# Patient Record
Sex: Female | Born: 1955
Health system: Southern US, Community
[De-identification: ages and names within clinical notes are randomized; demographics above are authoritative.]

## PROBLEM LIST (undated history)

## (undated) DIAGNOSIS — G5600 Carpal tunnel syndrome, unspecified upper limb: Secondary | ICD-10-CM

## (undated) DIAGNOSIS — E0500 Thyrotoxicosis with diffuse goiter without thyrotoxic crisis or storm: Secondary | ICD-10-CM

## (undated) DIAGNOSIS — H9190 Unspecified hearing loss, unspecified ear: Secondary | ICD-10-CM

## (undated) DIAGNOSIS — M1990 Unspecified osteoarthritis, unspecified site: Secondary | ICD-10-CM

## (undated) DIAGNOSIS — M130 Polyarthritis, unspecified: Secondary | ICD-10-CM

## (undated) DIAGNOSIS — I341 Nonrheumatic mitral (valve) prolapse: Secondary | ICD-10-CM

## (undated) DIAGNOSIS — E079 Disorder of thyroid, unspecified: Secondary | ICD-10-CM

## (undated) HISTORY — DX: Polyarthritis, unspecified: M13.0

## (undated) HISTORY — DX: Thyrotoxicosis with diffuse goiter without thyrotoxic crisis or storm: E05.00

## (undated) HISTORY — DX: Unspecified osteoarthritis, unspecified site: M19.90

## (undated) HISTORY — DX: Unspecified hearing loss, unspecified ear: H91.90

## (undated) HISTORY — DX: Disorder of thyroid, unspecified: E07.9

## (undated) HISTORY — DX: Carpal tunnel syndrome, unspecified upper limb: G56.00

## (undated) HISTORY — DX: Nonrheumatic mitral (valve) prolapse: I34.1

---

## 2004-02-23 ENCOUNTER — Emergency Department (HOSPITAL_COMMUNITY): Admission: EM | Admit: 2004-02-23 | Discharge: 2004-02-23 | Payer: Self-pay | Admitting: Emergency Medicine

## 2004-05-05 ENCOUNTER — Ambulatory Visit (HOSPITAL_COMMUNITY): Admission: RE | Admit: 2004-05-05 | Discharge: 2004-05-05 | Payer: Self-pay | Admitting: Internal Medicine

## 2007-07-11 ENCOUNTER — Encounter: Admission: RE | Admit: 2007-07-11 | Discharge: 2007-07-11 | Payer: Self-pay | Admitting: Family Medicine

## 2009-05-24 ENCOUNTER — Emergency Department (HOSPITAL_COMMUNITY): Admission: EM | Admit: 2009-05-24 | Discharge: 2009-05-24 | Payer: Self-pay | Admitting: Emergency Medicine

## 2009-05-27 ENCOUNTER — Inpatient Hospital Stay (HOSPITAL_BASED_OUTPATIENT_CLINIC_OR_DEPARTMENT_OTHER): Admission: RE | Admit: 2009-05-27 | Discharge: 2009-05-27 | Payer: Self-pay | Admitting: Cardiology

## 2011-01-13 LAB — POCT I-STAT, CHEM 8
BUN: 14 mg/dL (ref 6–23)
Calcium, Ion: 1.18 mmol/L (ref 1.12–1.32)
Chloride: 105 mEq/L (ref 96–112)
Creatinine, Ser: 1 mg/dL (ref 0.4–1.2)
Glucose, Bld: 90 mg/dL (ref 70–99)
HCT: 50 % — ABNORMAL HIGH (ref 36.0–46.0)
Hemoglobin: 17 g/dL — ABNORMAL HIGH (ref 12.0–15.0)
Potassium: 4.2 mEq/L (ref 3.5–5.1)
Sodium: 137 mEq/L (ref 135–145)
TCO2: 24 mmol/L (ref 0–100)

## 2011-01-13 LAB — POCT CARDIAC MARKERS
CKMB, poc: 1.1 ng/mL (ref 1.0–8.0)
CKMB, poc: <1 ng/mL — ABNORMAL LOW (ref 1.0–8.0)
CKMB, poc: <1 ng/mL — ABNORMAL LOW (ref 1.0–8.0)
Myoglobin, poc: 34.8 ng/mL (ref 12–200)
Myoglobin, poc: 40.1 ng/mL (ref 12–200)
Myoglobin, poc: 51.7 ng/mL (ref 12–200)
Troponin i, poc: <0.05 ng/mL (ref 0.00–0.09)
Troponin i, poc: <0.05 ng/mL (ref 0.00–0.09)
Troponin i, poc: <0.05 ng/mL (ref 0.00–0.09)

## 2011-02-20 NOTE — Consult Note (Signed)
NAME:  Ann Chan, Ann Chan NO.:  [id number]   MEDICAL RECORD NO.:  [id number]          PATIENT TYPE:  EMS   LOCATION:  MAJO                         FACILITY:  [facility]   PHYSICIAN:  Lyn Records, M.D.   DATE OF BIRTH:  1956/01/11   DATE OF CONSULTATION:  05/24/2009  DATE OF DISCHARGE:                                 CONSULTATION   REASON FOR CONSULTATION:  Chest pain.   CONCLUSIONS:  1. Prolonged chest pain with negative cardiac markers and EKGs.  2. Positive smoking history.  3. Positive family history of coronary artery disease.   RECOMMENDATIONS:  1. If cardiac markers are negative, the patient can be discharged and      follow up with Dr. Anne Fu on May 25, 2009, at 10:00 a.m. at      [facility].  If markers are positive, the patient will need      to stay and have further evaluation perhaps with inpatient nuclear      testing or coronary angiography.  2. Aspirin 1 per day.   COMMENTS:  The patient is 52.  She has a history of recurring chest pain  and anxiety.  She denies other significant medical problems.  She smokes  one and half to two packs of cigarettes per day.  She denies ethanol  consumption.  There is a positive family history of both her brothers,  sister, and father having had intervention or CABG.   PHYSICAL EXAMINATION:  VITAL SIGNS:  The blood pressure is 132/80 and  heart rate is 80.  NECK:  No carotid bruits are heard.  LUNGS:  Clear.  CARDIAC:  Late systolic click.  No murmur.  ABDOMEN:  Soft.  EXTREMITIES:  No edema.   EKG reveals left axis deviation, but no acute ST-T wave change.  Initial  point-of-care markers are negative.   DISCUSSION:  The patient has had intermittent left parasternal  discomfort dating back over a year.  A Cardiolite study done in April  2009 was negative for ischemia but had a fixed anterior defect felt to  represent breast  attenuation.  LV function was normal.  Since that time, she has had  intermittent discomfort but today was much more severe.  She will need  further cardiac evaluation with her cardiologist, Dr. Donato Schultz.  If  she rules out, I have an appointment made for her to see him tomorrow  morning at 10 a.m.      Lyn Records, M.D.  Electronically Signed     HWS/MEDQ  D:  05/24/2009  T:  05/24/2009  Job:  [id number]   cc:   Jake Bathe, MD  Chales Salmon. Abigail Miyamoto, M.D.

## 2011-02-20 NOTE — Cardiovascular Report (Signed)
NAME:  Ann Chan, Ann Chan NO.:  [id number]   MEDICAL RECORD NO.:  [id number]          PATIENT TYPE:  OIB   LOCATION:  1961                         FACILITY:  [facility]   PHYSICIAN:  Jake Bathe, MD      DATE OF BIRTH:  28-Dec-1955   DATE OF PROCEDURE:  05/27/2009  DATE OF DISCHARGE:                            CARDIAC CATHETERIZATION   PROCEDURES:  1. Left heart catheterization.  2. Selective coronary angiography.  3. Left ventriculogram.  4. Abdominal aortogram.   INDICATIONS:  A 55 year old female smoker with early family history of  coronary artery disease, hypertensive with recent emergency room visit  on May 24, 2009, with recurrent substernal chest discomfort with  occasional radiation to her jaw.  Last year, she had a nuclear stress  test, which showed no evidence of ischemia.  Her chest pain at that time  lasted for approximately 4 hours and it gradually eased off.  She  continues to smoke 2 packs a day.   PROCEDURE DETAILS:  An informed consent was obtained.  Risk of stroke,  heart attack, death, renal impairment, arterial bleeding or arterial  damage were explained to the patient at length.  She was placed in the  catheterization table, prepped in a sterile fashion.  Lidocaine 1% was  used for local anesthesia.  Visualization of the femoral head was  obtained via fluoroscopy.  A 4-French sheath was placed into the right  femoral artery utilizing the modified Seldinger technique.  A Judkins  left #4 and no Torque Williams right catheter was used to selectively  cannulate the coronary arteries.  Multiple views with hand injection of  Omnipaque were obtained.  An angled pigtail was used to cross into the  left ventricle.  A left ventriculogram in the RAO position was obtained  utilizing 30 mL of contrast.  Pullback was obtained across the aortic  valve.  The pigtail catheter was then brought to the level of the renal  arteries and abdominal aortogram was  then shot with 30 mL of contrast.  After the procedure, sheath was removed.  The patient was  hemodynamically stable and tolerated the procedure well.  Her blood  pressure was slightly elevated at the beginning of the procedure with  150 systolic over 90.   FINDINGS:  1. Left main artery - this is a relatively small-caliber vessel      equaling the size of the circumflex artery.  During injection,      there is adequate blow back of contrast into the aorta.  There was      never any dampening during procedure.  Left main artery branches      into the circumflex ramus and left anterior descending artery.      There is a small amount of calcification present at the ostium of      the left main artery.  The initial injection of the left main      artery and the LAO caudal shot shows the caliber of the left main      smaller than the other shocks.  Likely, there was a  small degree of      vasospasm present.  2. Left anterior descending artery.  This vessel comprises 2 diagonal      branches and continues to wrap around the apex.  Once again      arterial caliber is quite small.  In the mid segment, there is a      hinge point, which likely represents portion of intramyocardial      vessel.  3. Ramus/circumflex artery - the ostium of the ramus artery is large      and slightly aneurysmal then tapers into a small-caliber long ramus      artery branch.  The circumflex artery has 3 obtuse marginal      branches.  In the mid segment of the circumflex, there is minor      irregularity noted, but no significant flow-limiting disease.  4. Right coronary artery - this is the dominant vessel giving rise to      the posterior descending artery once again a small-caliber vessel.      No evidence of any angiographically significant coronary artery      disease.  There is a mild irregularity noted in the mid segment up      to 10% stenosis.  5. Abdominal aortogram - no evidence of renal artery  stenosis.  No      evidence of abdominal aortic aneurysm.  The distal aorta tapers to      a relatively small-caliber vessel at the branch point of the      iliacs.  There is no significant stenosis, however.  6. Left ventriculogram - normal left ventricular ejection fraction      with no wall motion abnormalities.  Ejection fraction is 60%.   HEMODYNAMICS:  Left ventricular systolic pressure is 123, with an end-  diastolic pressure of 14 and an aortic pressure 121/60, with a mean of  91 mmHg.  There is no evidence of any aortic stenosis.   IMPRESSIONS:  1. Small caliber left main artery with ostial calcification noted with      arterial size approximating the size of the circumflex and left      anterior descending artery.  I do believe that this stenosis is non-      flow limiting after reviewing several of the images.  First shock      most likely comprised a bit of vasospasm.  There was no catheter      dampening during study.  2. Minor irregularities throughout other coronary arteries.  Normal      left ventricular ejection fraction with normal ejection fraction of      60%.  3. No abdominal aortic aneurysm or renal artery stenosis.  4. Small-caliber distal abdominal aorta with no evidence of any      significant stenosis present.   PLAN:  Continue to modify any risk factors, especially smoking  cessation.  I will also concentrate on her hypertension.  Given her  relatively small-caliber vessels and some evidence of vasoreactivity, I  would like to try low-dose isosorbide mononitrate/Imdur 30 mg once a day  to see if this will help.  Of course, smoking cessation will be the best  for her.  I will see her back in clinic and 1-2 weeks.      Jake Bathe, MD  Electronically Signed     MCS/MEDQ  D:  05/27/2009  T:  05/27/2009  Job:  [id number]

## 2011-07-24 ENCOUNTER — Other Ambulatory Visit: Payer: Self-pay | Admitting: Family Medicine

## 2011-07-24 DIAGNOSIS — IMO0002 Reserved for concepts with insufficient information to code with codable children: Secondary | ICD-10-CM

## 2011-08-17 ENCOUNTER — Ambulatory Visit: Payer: Self-pay | Admitting: Physical Therapy

## 2011-08-21 ENCOUNTER — Other Ambulatory Visit: Payer: Self-pay

## 2014-04-30 ENCOUNTER — Other Ambulatory Visit: Payer: Self-pay | Admitting: Family Medicine

## 2014-04-30 DIAGNOSIS — E038 Other specified hypothyroidism: Secondary | ICD-10-CM

## 2014-05-07 ENCOUNTER — Ambulatory Visit (INDEPENDENT_AMBULATORY_CARE_PROVIDER_SITE_OTHER): Payer: BC Managed Care – PPO | Admitting: Surgery

## 2014-05-07 ENCOUNTER — Other Ambulatory Visit: Payer: Self-pay

## 2014-05-07 ENCOUNTER — Encounter (INDEPENDENT_AMBULATORY_CARE_PROVIDER_SITE_OTHER): Payer: Self-pay

## 2014-05-07 ENCOUNTER — Encounter (INDEPENDENT_AMBULATORY_CARE_PROVIDER_SITE_OTHER): Payer: Self-pay | Admitting: Surgery

## 2014-05-07 ENCOUNTER — Other Ambulatory Visit (INDEPENDENT_AMBULATORY_CARE_PROVIDER_SITE_OTHER): Payer: Self-pay | Admitting: Surgery

## 2014-05-07 ENCOUNTER — Other Ambulatory Visit (INDEPENDENT_AMBULATORY_CARE_PROVIDER_SITE_OTHER): Payer: Self-pay

## 2014-05-07 VITALS — BP 142/78 | HR 76 | Temp 97.9°F | Ht 65.0 in | Wt 133.0 lb

## 2014-05-07 DIAGNOSIS — L0291 Cutaneous abscess, unspecified: Secondary | ICD-10-CM

## 2014-05-07 DIAGNOSIS — L0390 Cellulitis, unspecified: Principal | ICD-10-CM

## 2014-05-07 MED ORDER — HYDROCODONE-ACETAMINOPHEN 5-325 MG PO TABS: 1.0000 | ORAL_TABLET | ORAL | Status: DC | PRN

## 2014-05-07 MED ORDER — DOXYCYCLINE HYCLATE 100 MG PO TABS: 100.0000 mg | ORAL_TABLET | Freq: Two times a day (BID) | ORAL | Status: DC

## 2014-05-07 NOTE — Patient Instructions (Signed)
Bathe with Hibiclens daily--bedtime and in morning.

## 2014-05-07 NOTE — Progress Notes (Signed)
URGENT Office Ann Chan 58 y.o.  Body mass index is 22.13 kg/(m^2).  There are no active problems to display for this patient.   Allergies  Allergen Reactions  . Sulfur     History reviewed. No pertinent past surgical history. No primary provider on file. No diagnosis found.  Left nipple abscess at 9 oclock.  A discrete palpable was seen within the nipple prior to descending onto the areolar complex. The area surrounding was read but no fluctuant areas were noted. Elected to prep the area with chlorhexidine in with an 18-gauge needle I excised the roof of this well formed small furuncle.  The pus that came out was cultured. It was thick dense foul-smelling. She is allergic to Bactrim so I will prescribe doxycycline and give her some Norco for pain.    Impression:   Left nipple abscess-drained and cultured.   Matt B. Daphine DeutscherMartin, MD, [facility]  [facility] [phone] beeper [phone]  05/07/2014 5:21 PM

## 2014-05-10 ENCOUNTER — Ambulatory Visit (INDEPENDENT_AMBULATORY_CARE_PROVIDER_SITE_OTHER): Payer: BC Managed Care – PPO | Admitting: General Surgery

## 2014-05-10 ENCOUNTER — Encounter (INDEPENDENT_AMBULATORY_CARE_PROVIDER_SITE_OTHER): Payer: Self-pay | Admitting: General Surgery

## 2014-05-10 VITALS — BP 162/84 | HR 78 | Temp 97.5°F | Resp 18 | Ht 65.0 in | Wt 134.0 lb

## 2014-05-10 DIAGNOSIS — L0390 Cellulitis, unspecified: Principal | ICD-10-CM

## 2014-05-10 DIAGNOSIS — L0291 Cutaneous abscess, unspecified: Secondary | ICD-10-CM

## 2014-05-10 LAB — WOUND CULTURE: Gram Stain: NONE SEEN

## 2014-05-10 NOTE — Patient Instructions (Signed)
Keep the area clean and finish her course of antibiotics. Call as needed for any recurrent or persistent pain, swelling, redness or drainage.

## 2014-05-10 NOTE — Progress Notes (Signed)
History: Patient underwent incision and drainage of a small abscess at the edge of the left nipple by Dr. Daphine DeutscherMartin 3 days ago. She reports she is feeling a lot better. No drainage over the last day.  Exam: BP 162/84  Pulse 78  Temp(Src) 97.5 F (36.4 C)  Resp 18  Ht 5\' 5"  (1.651 m)  Wt 134 lb (60.782 kg)  BMI 22.30 kg/m2 General: Appears well, no distress Breasts: There is an apparent small I&D site as the medial edge of the left nipple that has healed over. There is no drainage or fluctuance. No significant erythema or tenderness.  Assessment and plan: Status post I&D of left nipple abscess. This appears to be healing well. She has never had a previous abscess here and I don't see evidence for fistula at this time. She is a nonsmoker. She will complete her L. Biotics and call as needed for any recurrent or persistent symptoms

## 2014-05-11 ENCOUNTER — Telehealth (INDEPENDENT_AMBULATORY_CARE_PROVIDER_SITE_OTHER): Payer: Self-pay

## 2014-05-11 NOTE — Telephone Encounter (Signed)
Patient requesting a RTW note dated for 05/11/14.  [facility] [phone]

## 2014-05-21 ENCOUNTER — Other Ambulatory Visit: Payer: Self-pay

## 2014-05-24 ENCOUNTER — Ambulatory Visit
Admission: RE | Admit: 2014-05-24 | Discharge: 2014-05-24 | Disposition: A | Discharge status: Home / Self Care | Payer: BC Managed Care – PPO | Source: Ambulatory Visit | Attending: Family Medicine | Admitting: Family Medicine

## 2014-05-24 DIAGNOSIS — E038 Other specified hypothyroidism: Secondary | ICD-10-CM

## 2015-01-14 DIAGNOSIS — E890 Postprocedural hypothyroidism: Secondary | ICD-10-CM

## 2015-01-14 HISTORY — DX: Postprocedural hypothyroidism: E89.0

## 2016-06-15 DIAGNOSIS — Z0000 Encounter for general adult medical examination without abnormal findings: Secondary | ICD-10-CM | POA: Diagnosis not present

## 2016-06-15 DIAGNOSIS — Z1211 Encounter for screening for malignant neoplasm of colon: Secondary | ICD-10-CM | POA: Diagnosis not present

## 2016-06-15 DIAGNOSIS — E890 Postprocedural hypothyroidism: Secondary | ICD-10-CM | POA: Diagnosis not present

## 2016-07-20 DIAGNOSIS — L249 Irritant contact dermatitis, unspecified cause: Secondary | ICD-10-CM | POA: Diagnosis not present

## 2016-10-19 DIAGNOSIS — A630 Anogenital (venereal) warts: Secondary | ICD-10-CM | POA: Diagnosis not present

## 2016-10-19 DIAGNOSIS — L819 Disorder of pigmentation, unspecified: Secondary | ICD-10-CM | POA: Diagnosis not present

## 2017-03-26 DIAGNOSIS — F419 Anxiety disorder, unspecified: Secondary | ICD-10-CM

## 2017-03-26 DIAGNOSIS — E890 Postprocedural hypothyroidism: Secondary | ICD-10-CM | POA: Diagnosis not present

## 2017-03-26 HISTORY — DX: Anxiety disorder, unspecified: F41.9

## 2017-06-28 DIAGNOSIS — Z1159 Encounter for screening for other viral diseases: Secondary | ICD-10-CM | POA: Diagnosis not present

## 2017-06-28 DIAGNOSIS — Z0000 Encounter for general adult medical examination without abnormal findings: Secondary | ICD-10-CM | POA: Diagnosis not present

## 2017-06-28 DIAGNOSIS — E890 Postprocedural hypothyroidism: Secondary | ICD-10-CM | POA: Diagnosis not present

## 2017-11-07 DIAGNOSIS — M25512 Pain in left shoulder: Secondary | ICD-10-CM | POA: Diagnosis not present

## 2017-11-07 DIAGNOSIS — M25541 Pain in joints of right hand: Secondary | ICD-10-CM | POA: Diagnosis not present

## 2017-11-22 DIAGNOSIS — M13841 Other specified arthritis, right hand: Secondary | ICD-10-CM | POA: Diagnosis not present

## 2017-11-22 DIAGNOSIS — M13842 Other specified arthritis, left hand: Secondary | ICD-10-CM | POA: Diagnosis not present

## 2017-11-22 DIAGNOSIS — R200 Anesthesia of skin: Secondary | ICD-10-CM | POA: Diagnosis not present

## 2017-12-20 DIAGNOSIS — G5603 Carpal tunnel syndrome, bilateral upper limbs: Secondary | ICD-10-CM | POA: Diagnosis not present

## 2017-12-20 DIAGNOSIS — M79642 Pain in left hand: Secondary | ICD-10-CM | POA: Diagnosis not present

## 2017-12-20 DIAGNOSIS — M79641 Pain in right hand: Secondary | ICD-10-CM | POA: Diagnosis not present

## 2017-12-20 DIAGNOSIS — M13841 Other specified arthritis, right hand: Secondary | ICD-10-CM | POA: Diagnosis not present

## 2018-04-04 DIAGNOSIS — M549 Dorsalgia, unspecified: Secondary | ICD-10-CM | POA: Diagnosis not present

## 2018-04-04 DIAGNOSIS — E890 Postprocedural hypothyroidism: Secondary | ICD-10-CM | POA: Diagnosis not present

## 2018-07-11 DIAGNOSIS — M25541 Pain in joints of right hand: Secondary | ICD-10-CM | POA: Diagnosis not present

## 2018-07-11 DIAGNOSIS — Z0000 Encounter for general adult medical examination without abnormal findings: Secondary | ICD-10-CM | POA: Diagnosis not present

## 2018-07-11 DIAGNOSIS — E890 Postprocedural hypothyroidism: Secondary | ICD-10-CM | POA: Diagnosis not present

## 2018-07-11 DIAGNOSIS — R0602 Shortness of breath: Secondary | ICD-10-CM | POA: Diagnosis not present

## 2018-07-11 DIAGNOSIS — Z23 Encounter for immunization: Secondary | ICD-10-CM | POA: Diagnosis not present

## 2018-09-19 DIAGNOSIS — M19041 Primary osteoarthritis, right hand: Secondary | ICD-10-CM | POA: Diagnosis not present

## 2018-09-19 DIAGNOSIS — Z7689 Persons encountering health services in other specified circumstances: Secondary | ICD-10-CM | POA: Diagnosis not present

## 2018-09-19 DIAGNOSIS — E039 Hypothyroidism, unspecified: Secondary | ICD-10-CM | POA: Diagnosis not present

## 2018-09-19 DIAGNOSIS — Z0000 Encounter for general adult medical examination without abnormal findings: Secondary | ICD-10-CM | POA: Diagnosis not present

## 2018-10-08 ENCOUNTER — Emergency Department (HOSPITAL_COMMUNITY)
Admission: EM | Admit: 2018-10-08 | Discharge: 2018-10-08 | Disposition: A | Discharge status: Home / Self Care | Payer: BLUE CROSS/BLUE SHIELD | Attending: Emergency Medicine | Admitting: Emergency Medicine

## 2018-10-08 ENCOUNTER — Other Ambulatory Visit: Payer: Self-pay

## 2018-10-08 ENCOUNTER — Emergency Department (HOSPITAL_COMMUNITY): Payer: BLUE CROSS/BLUE SHIELD

## 2018-10-08 ENCOUNTER — Encounter (HOSPITAL_COMMUNITY): Payer: Self-pay | Admitting: Emergency Medicine

## 2018-10-08 DIAGNOSIS — I10 Essential (primary) hypertension: Secondary | ICD-10-CM | POA: Diagnosis not present

## 2018-10-08 DIAGNOSIS — R221 Localized swelling, mass and lump, neck: Secondary | ICD-10-CM | POA: Diagnosis present

## 2018-10-08 DIAGNOSIS — R600 Localized edema: Secondary | ICD-10-CM

## 2018-10-08 DIAGNOSIS — R609 Edema, unspecified: Secondary | ICD-10-CM

## 2018-10-08 DIAGNOSIS — K119 Disease of salivary gland, unspecified: Secondary | ICD-10-CM | POA: Diagnosis not present

## 2018-10-08 DIAGNOSIS — R51 Headache: Secondary | ICD-10-CM | POA: Diagnosis not present

## 2018-10-08 DIAGNOSIS — R590 Localized enlarged lymph nodes: Secondary | ICD-10-CM | POA: Diagnosis not present

## 2018-10-08 DIAGNOSIS — Z87891 Personal history of nicotine dependence: Secondary | ICD-10-CM | POA: Insufficient documentation

## 2018-10-08 DIAGNOSIS — Z79899 Other long term (current) drug therapy: Secondary | ICD-10-CM | POA: Insufficient documentation

## 2018-10-08 DIAGNOSIS — R112 Nausea with vomiting, unspecified: Secondary | ICD-10-CM | POA: Diagnosis not present

## 2018-10-08 DIAGNOSIS — R05 Cough: Secondary | ICD-10-CM | POA: Diagnosis not present

## 2018-10-08 LAB — COMPREHENSIVE METABOLIC PANEL
ALT: 23 U/L (ref 0–44)
AST: 24 U/L (ref 15–41)
Albumin: 4.6 g/dL (ref 3.5–5.0)
Alkaline Phosphatase: 87 U/L (ref 38–126)
Anion gap: 10 (ref 5–15)
BILIRUBIN TOTAL: 0.5 mg/dL (ref 0.3–1.2)
BUN: 12 mg/dL (ref 6–20)
CHLORIDE: 102 mmol/L (ref 98–111)
CO2: 27 mmol/L (ref 22–32)
CREATININE: 0.9 mg/dL (ref 0.44–1.00)
Calcium: 9.7 mg/dL (ref 8.9–10.3)
GFR calc Af Amer: >60 mL/min (ref >60)
Glucose, Bld: 94 mg/dL (ref 70–99)
POTASSIUM: 4 mmol/L (ref 3.5–5.1)
Sodium: 139 mmol/L (ref 135–145)
TOTAL PROTEIN: 7.5 g/dL (ref 6.5–8.1)

## 2018-10-08 LAB — CBC
HEMATOCRIT: 45.9 % (ref 36.0–46.0)
HEMOGLOBIN: 15.2 g/dL — AB (ref 12.0–15.0)
MCH: 31.9 pg (ref 26.0–34.0)
MCHC: 33.1 g/dL (ref 30.0–36.0)
MCV: 96.4 fL (ref 80.0–100.0)
NRBC: 0 % (ref 0.0–0.2)
Platelets: 231 10*3/uL (ref 150–400)
RBC: 4.76 MIL/uL (ref 3.87–5.11)
RDW: 12.1 % (ref 11.5–15.5)
WBC: 7.6 10*3/uL (ref 4.0–10.5)

## 2018-10-08 LAB — LIPASE, BLOOD: LIPASE: 42 U/L (ref 11–51)

## 2018-10-08 MED ORDER — CLONIDINE HCL 0.1 MG PO TABS
0.1000 mg | ORAL_TABLET | Freq: Once | ORAL | Status: AC
  Administered 2018-10-08: 0.1 mg via ORAL
  Filled 2018-10-08: qty 1

## 2018-10-08 NOTE — ED Triage Notes (Signed)
Pt complaint of headache, n/v, and chills onset today; dry cough for a few days.

## 2018-10-08 NOTE — Discharge Instructions (Addendum)
These use sour candy for swelling in the submandibular area Recheck with your doctor if swelling does not resolve Have your blood pressure rechecked with your doctor later this week

## 2018-10-08 NOTE — ED Notes (Signed)
Patient transported to CT 

## 2018-10-08 NOTE — ED Provider Notes (Signed)
[facility]-EMERGENCY DEPT Provider Note   CSN: [id number] Arrival date & time: 10/08/18  1714     History   Chief Complaint Chief Complaint  Patient presents with  . Emesis  . Headache    HPI Ann Chan is a 63 y.o. female.  HPI  63 yo female states not feeling well. She ahd some pressure over right eye that resolved.  Then, noted swelling in throat.  This increases whenever she sees anything to eat or drink.  States that it is on both sides.  She denies any headache now, nasal congestion, chest pain, cough, fever, or chills.  Past Medical History:  Diagnosis Date  . Arthritis   . Thyroid disease     There are no active problems to display for this patient.   History reviewed. No pertinent surgical history.   OB History   No obstetric history on file.      Home Medications    Prior to Admission medications   Medication Sig Start Date End Date Taking? Authorizing Provider  doxycycline (VIBRA-TABS) 100 MG tablet Take 1 tablet (100 mg total) by mouth 2 (two) times daily. 05/07/14   Luretha Murphy, MD  HYDROcodone-acetaminophen (NORCO) 5-325 MG per tablet Take 1 tablet by mouth every 4 (four) hours as needed for moderate pain. 05/07/14   Luretha Murphy, MD  levothyroxine (SYNTHROID, LEVOTHROID) 112 MCG tablet Take 112 mcg by mouth daily before breakfast.    [provider]    Family History Family History  Problem Relation Age of Onset  . Heart disease Father     Social History Social History   Tobacco Use  . Smoking status: Former Smoker    Types: Cigarettes    Last attempt to quit: 05/07/2013    Years since quitting: 5.4  Substance Use Topics  . Alcohol use: Yes  . Drug use: No     Allergies   Sulfur   Review of Systems Review of Systems  Constitutional: Negative for activity change, appetite change, chills, fever and unexpected weight change.  HENT: Negative for rhinorrhea, sinus pressure and sore throat.     Eyes: Negative for visual disturbance.  Respiratory: Negative for cough and shortness of breath.   Cardiovascular: Negative for chest pain and leg swelling.  Gastrointestinal: Negative for abdominal pain, blood in stool, diarrhea and vomiting.  Genitourinary: Negative for difficulty urinating, dysuria, frequency, urgency and vaginal discharge.  Musculoskeletal: Negative for arthralgias, gait problem, myalgias, neck pain and neck stiffness.  Skin: Negative for color change and rash.  Neurological: Negative for weakness, light-headedness and headaches.  Hematological: Does not bruise/bleed easily.  Psychiatric/Behavioral: Negative for dysphoric mood.  All other systems reviewed and are negative.    Physical Exam Updated Vital Signs BP (!) 186/122 (BP Location: Right Arm)   Pulse (!) 105   Temp 98.2 F (36.8 C) (Oral)   Resp 18   SpO2 96%   Physical Exam Vitals signs and nursing note reviewed.  Constitutional:      Appearance: She is well-developed.  HENT:     Head: Normocephalic.     Mouth/Throat:     Mouth: Mucous membranes are moist.     Comments: Patient given water to drink as she had swelling in the left submandibular area Eyes:     Extraocular Movements: Extraocular movements intact.  Neck:     Musculoskeletal: Normal range of motion and neck supple.  Cardiovascular:     Rate and Rhythm: Normal rate.  Pulmonary:  Effort: Pulmonary effort is normal.  Abdominal:     Palpations: Abdomen is soft.  Musculoskeletal: Normal range of motion.  Skin:    General: Skin is warm.     Capillary Refill: Capillary refill takes less than 2 seconds.  Neurological:     Mental Status: She is alert.     Cranial Nerves: No cranial nerve deficit.     Sensory: No sensory deficit.     Deep Tendon Reflexes: Reflexes normal.  Psychiatric:        Mood and Affect: Mood normal.      ED Treatments / Results  Labs (all labs ordered are listed, but only abnormal results are  displayed) Labs Reviewed  CBC - Abnormal; Notable for the following components:      Result Value   Hemoglobin 15.2 (*)    All other components within normal limits  LIPASE, BLOOD  COMPREHENSIVE METABOLIC PANEL  URINALYSIS, ROUTINE W REFLEX MICROSCOPIC    EKG None  Radiology Dg Chest 2 View  Result Date: 10/08/2018 CLINICAL DATA:  Cough, current smoker EXAM: CHEST - 2 VIEW COMPARISON:  07/11/2018 chest radiograph. FINDINGS: Stable cardiomediastinal silhouette with normal heart size. No pneumothorax. No pleural effusion. Hyperinflated lungs and emphysema. No pulmonary edema. No acute consolidative airspace disease. IMPRESSION: 1. No acute cardiopulmonary disease. 2. Hyperinflated lungs and emphysema, suggesting COPD. Electronically Signed   By: Delbert Phenix M.D.   On: 10/08/2018 18:16    Procedures Procedures (including critical care time)  Medications Ordered in ED Medications - No data to display   Initial Impression / Assessment and Plan / ED Course  I have reviewed the triage vital signs and the nursing notes.  Pertinent labs & imaging results that were available during my care of the patient were reviewed by me and considered in my medical decision making (see chart for details).     This is a 63 year old female who presents today with multiple complaints.  She had some headache is hypertensive and thus had a head CT.  There is no evidence of bleeding or abnormality on head CT.  Headache has essentially resolved.  She also complained of throat swelling.  I was able to observe her attempting to drink fluids and she had swelling in the left submandibular area consistent with stone.  Given 0.1 of clonidine here.  She is advised regarding need for recheck of blood pressure.  She is advised regarding sour for salivary stone involving left submandibular gland.  Final Clinical Impressions(s) / ED Diagnoses   Final diagnoses:  Hypertension, unspecified type  Submandibular gland  swelling    ED Discharge Orders    None       Margarita Grizzle, MD 10/08/18 2136

## 2018-10-08 NOTE — ED Notes (Signed)
Patient reports she is having swelling when she attempts to drink anything. Pt reports she is concerned she may have a "saliva stone" Pt reports the swelling goes down when she doesn't drink anything.

## 2018-10-08 NOTE — ED Notes (Signed)
Pt states "it is hard to swallow right now. It feels like food and drink get stuck in my throat."

## 2018-10-10 DIAGNOSIS — I10 Essential (primary) hypertension: Secondary | ICD-10-CM | POA: Diagnosis not present

## 2018-10-10 DIAGNOSIS — E039 Hypothyroidism, unspecified: Secondary | ICD-10-CM | POA: Diagnosis not present

## 2018-10-17 DIAGNOSIS — M150 Primary generalized (osteo)arthritis: Secondary | ICD-10-CM | POA: Diagnosis not present

## 2018-10-17 DIAGNOSIS — M2550 Pain in unspecified joint: Secondary | ICD-10-CM | POA: Diagnosis not present

## 2018-10-17 DIAGNOSIS — M7989 Other specified soft tissue disorders: Secondary | ICD-10-CM | POA: Diagnosis not present

## 2018-12-16 DIAGNOSIS — I10 Essential (primary) hypertension: Secondary | ICD-10-CM

## 2018-12-16 DIAGNOSIS — R05 Cough: Secondary | ICD-10-CM | POA: Diagnosis not present

## 2018-12-16 DIAGNOSIS — T464X5A Adverse effect of angiotensin-converting-enzyme inhibitors, initial encounter: Secondary | ICD-10-CM | POA: Diagnosis not present

## 2018-12-16 HISTORY — DX: Essential (primary) hypertension: I10

## 2018-12-24 ENCOUNTER — Encounter: Payer: Self-pay | Admitting: Gastroenterology

## 2019-01-14 DIAGNOSIS — I10 Essential (primary) hypertension: Secondary | ICD-10-CM | POA: Diagnosis not present

## 2019-01-14 DIAGNOSIS — K219 Gastro-esophageal reflux disease without esophagitis: Secondary | ICD-10-CM | POA: Diagnosis not present

## 2019-01-14 DIAGNOSIS — R0982 Postnasal drip: Secondary | ICD-10-CM | POA: Diagnosis not present

## 2019-01-14 DIAGNOSIS — E890 Postprocedural hypothyroidism: Secondary | ICD-10-CM | POA: Diagnosis not present

## 2019-05-18 DIAGNOSIS — E890 Postprocedural hypothyroidism: Secondary | ICD-10-CM | POA: Diagnosis not present

## 2019-05-18 DIAGNOSIS — I10 Essential (primary) hypertension: Secondary | ICD-10-CM | POA: Diagnosis not present

## 2019-08-05 IMAGING — CT CT HEAD W/O CM
3 series · 15 of 47 positions shown, 18 images · non-contrast
Comparison: MRI 07/11/2007, CT brain 05/05/2004

CLINICAL DATA: Headache nausea vomiting

EXAM:
CT HEAD WITHOUT CONTRAST
TECHNIQUE: Contiguous axial images were obtained from the base of the skull
through the vertex without intravenous contrast.

[Series 2: head wo · axial · 0.43mm/px · z∈[-130,-5]mm · 9 of 31 slices shown, 12 images]
[im 3/31  brain]
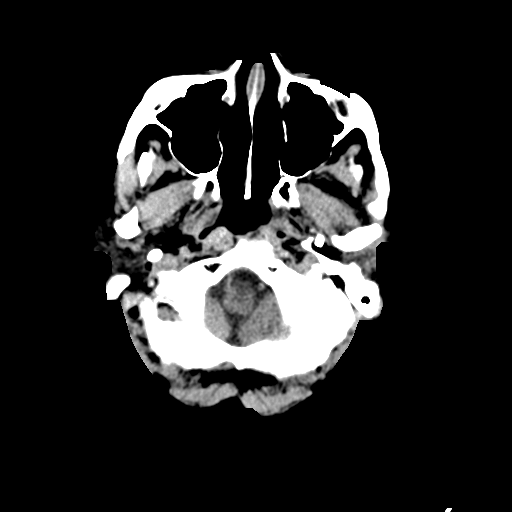
[im 3/31  bone]
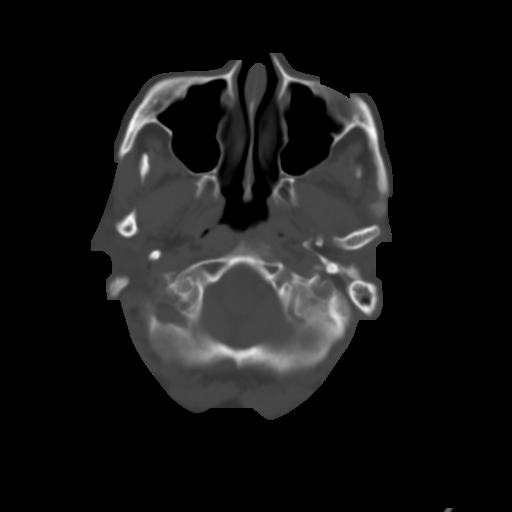
[im 6/31  brain]
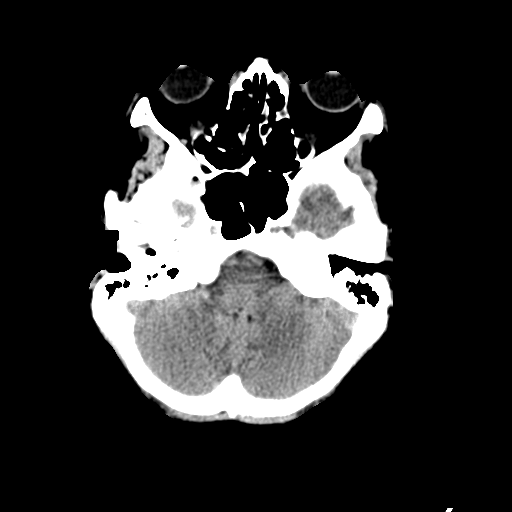
[im 9/31  brain]
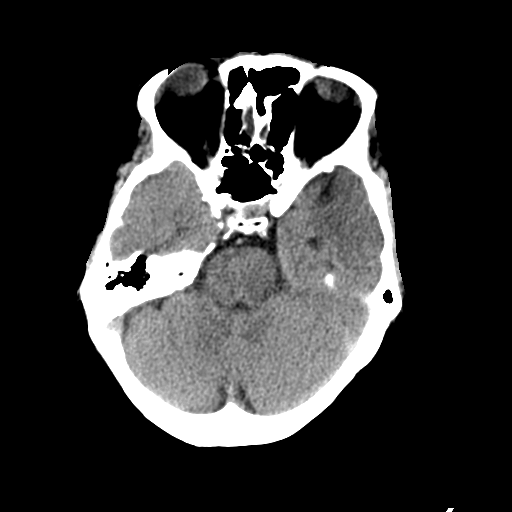
[im 12/31  brain]
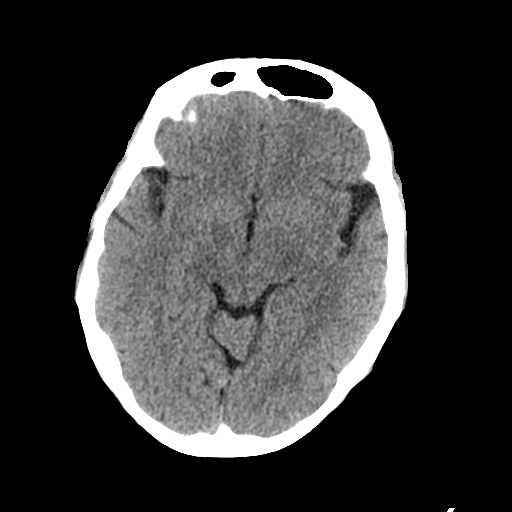
[im 16/31  brain]
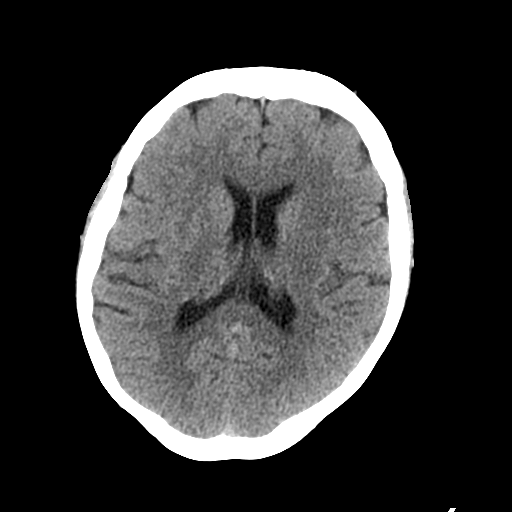
[im 16/31  bone]
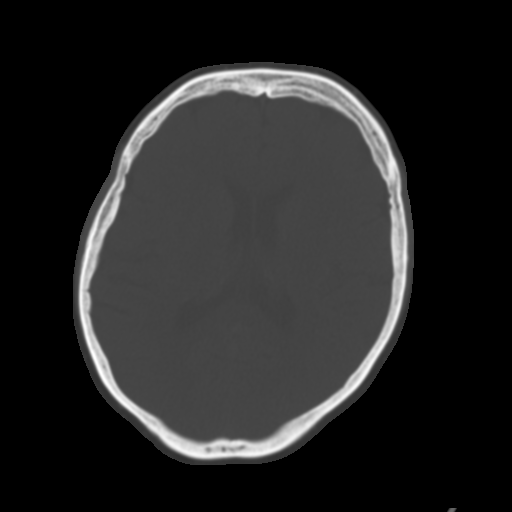
[im 19/31  brain]
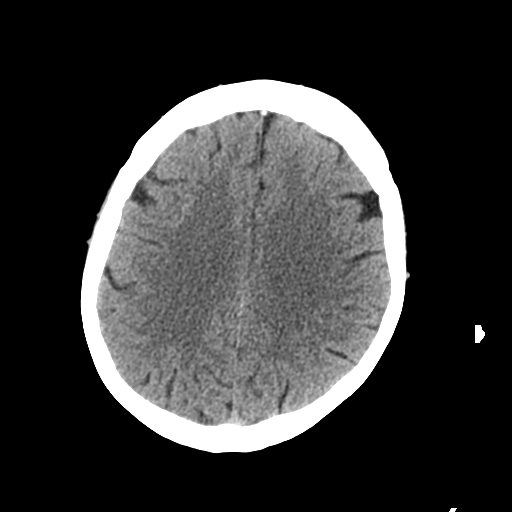
[im 22/31  brain]
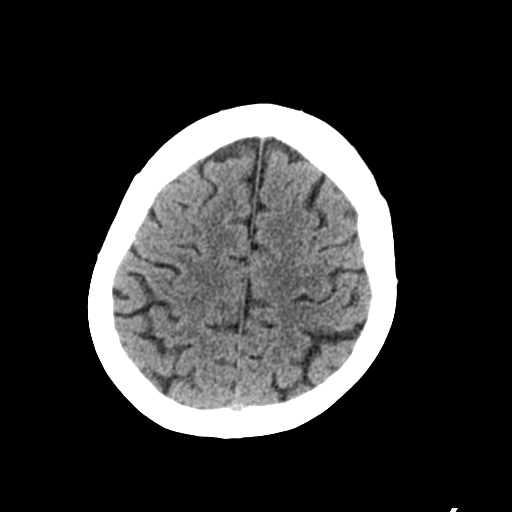
[im 25/31  brain]
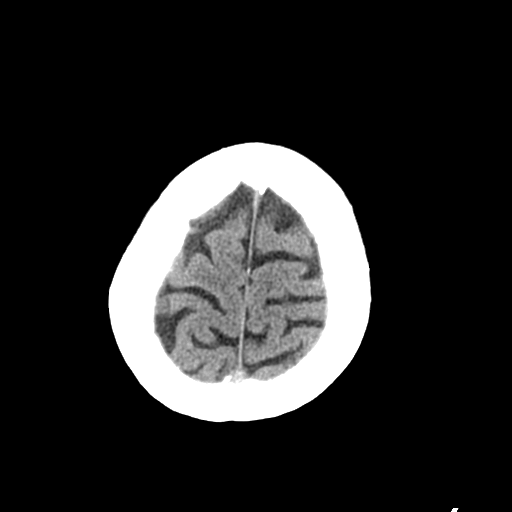
[im 28/31  brain]
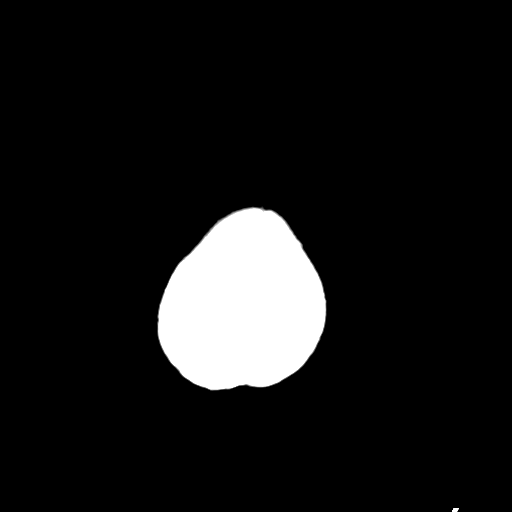
[im 28/31  bone]
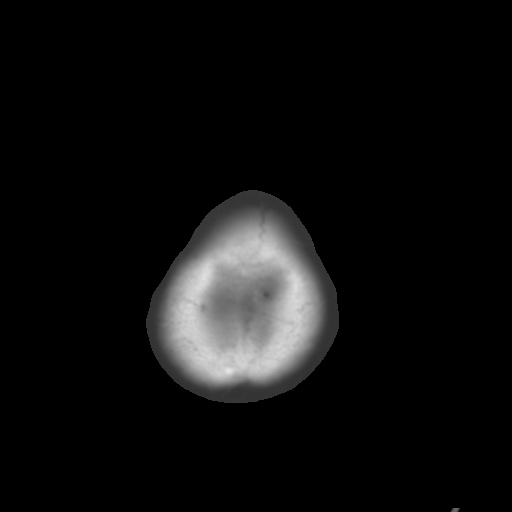

[Series 5: coronal soft tissue · coronal · 0.30mm/px · 3 of 66 slices shown]
[im 22/66  brain]
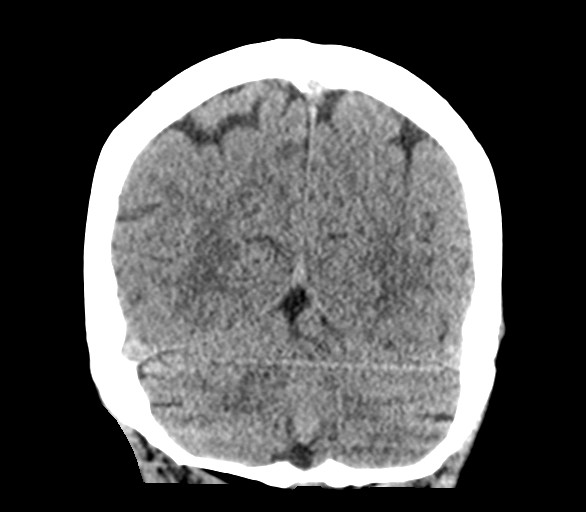
[im 29/66  brain]
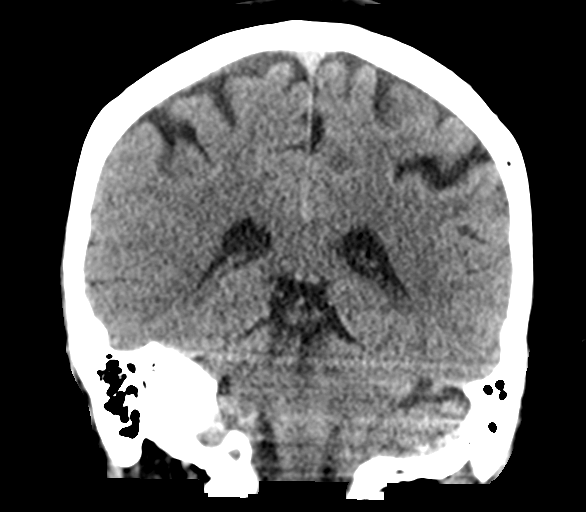
[im 37/66  brain]
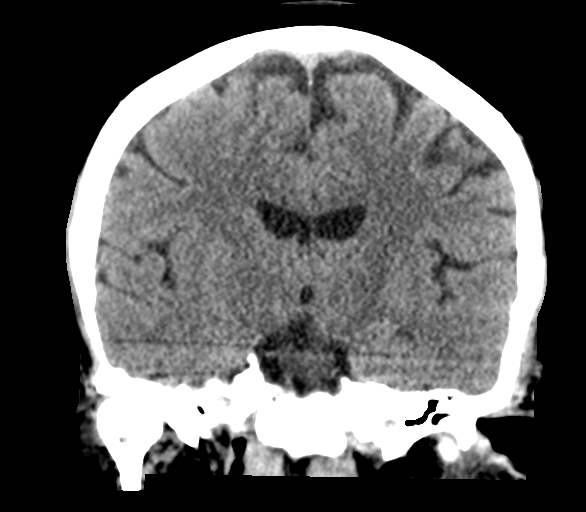

[Series 6: sagittal soft tissue · sagittal · 0.29mm/px · 3 of 56 slices shown]
[im 19/56  brain]
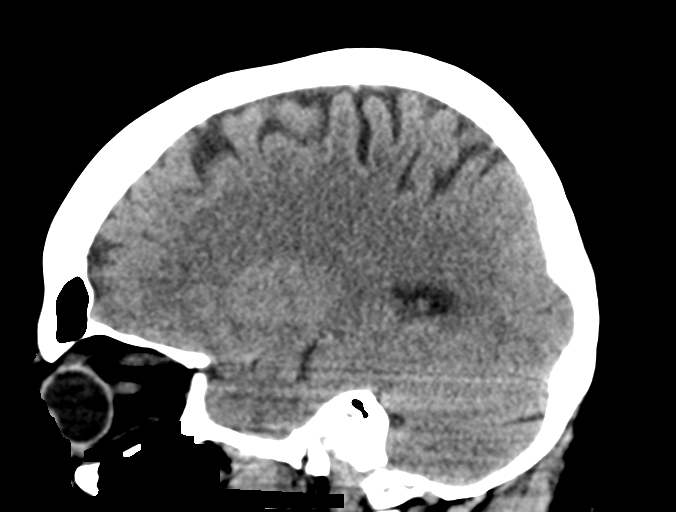
[im 28/56  brain]
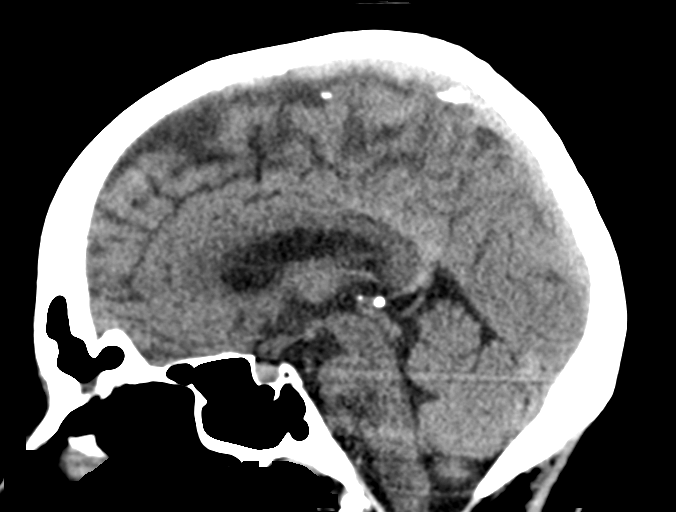
[im 37/56  brain]
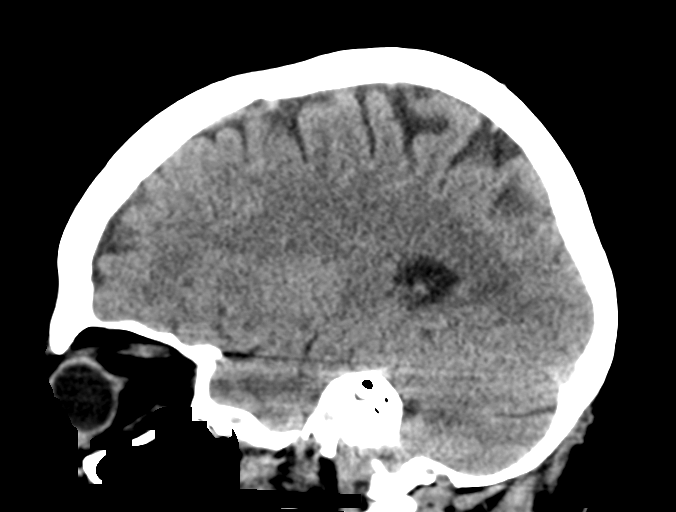

[15 of 47 positions shown; findings below may reference images not displayed]

FINDINGS: Brain: No evidence of acute infarction, hemorrhage, hydrocephalus,
extra-axial collection or mass lesion/mass effect.

Vascular: No hyperdense vessels.  Carotid vascular calcification

Skull: Normal. Negative for fracture or focal lesion.

Sinuses/Orbits: No acute finding.

Other: None
IMPRESSION: Negative non contrasted CT appearance of the brain

## 2019-08-05 IMAGING — CR DG CHEST 2V
2 series · 2 of 2 positions shown · non-contrast
Comparison: 07/11/2018 chest radiograph.

CLINICAL DATA: Cough, current smoker

EXAM:
CHEST - 2 VIEW

[w chest pa]
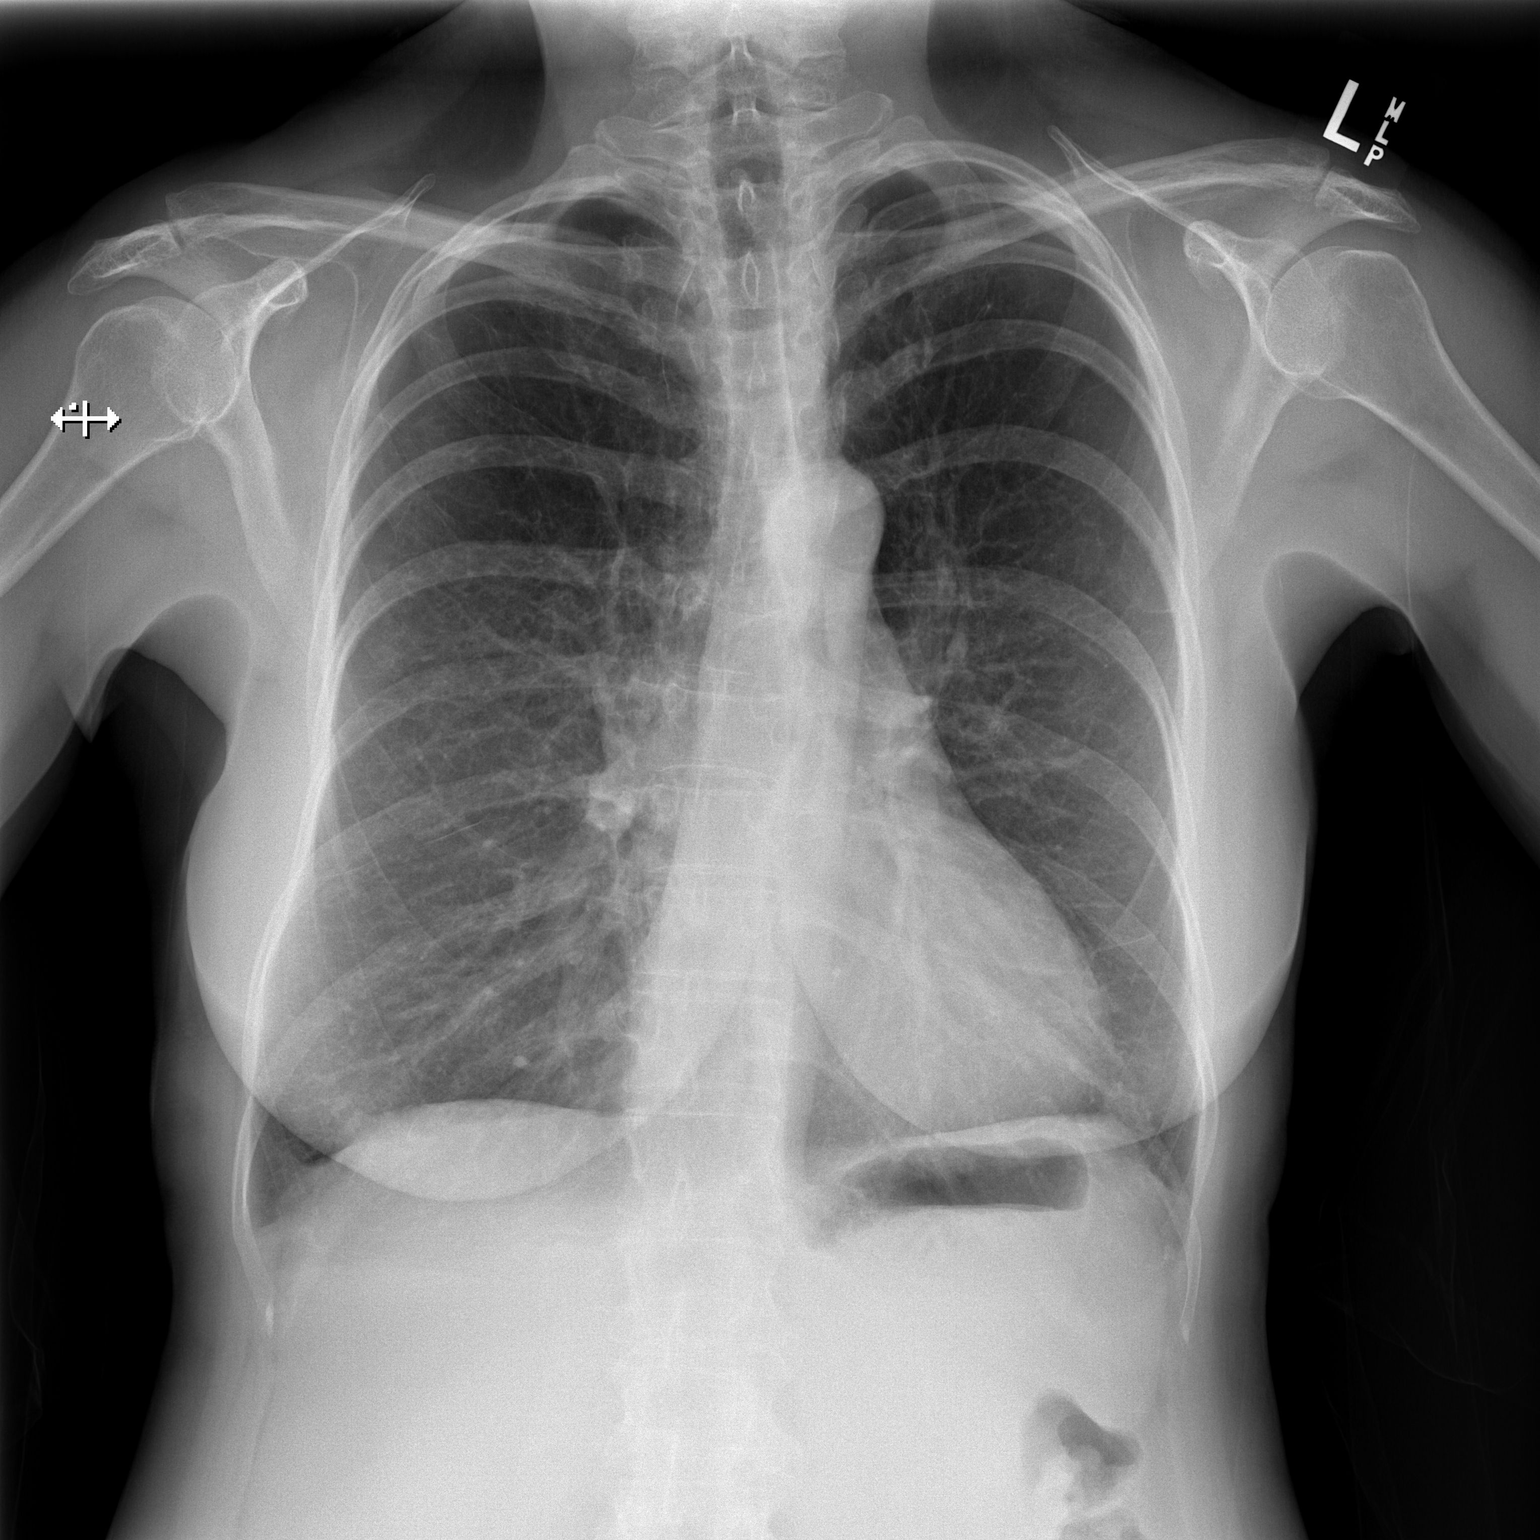

[w chest lat]
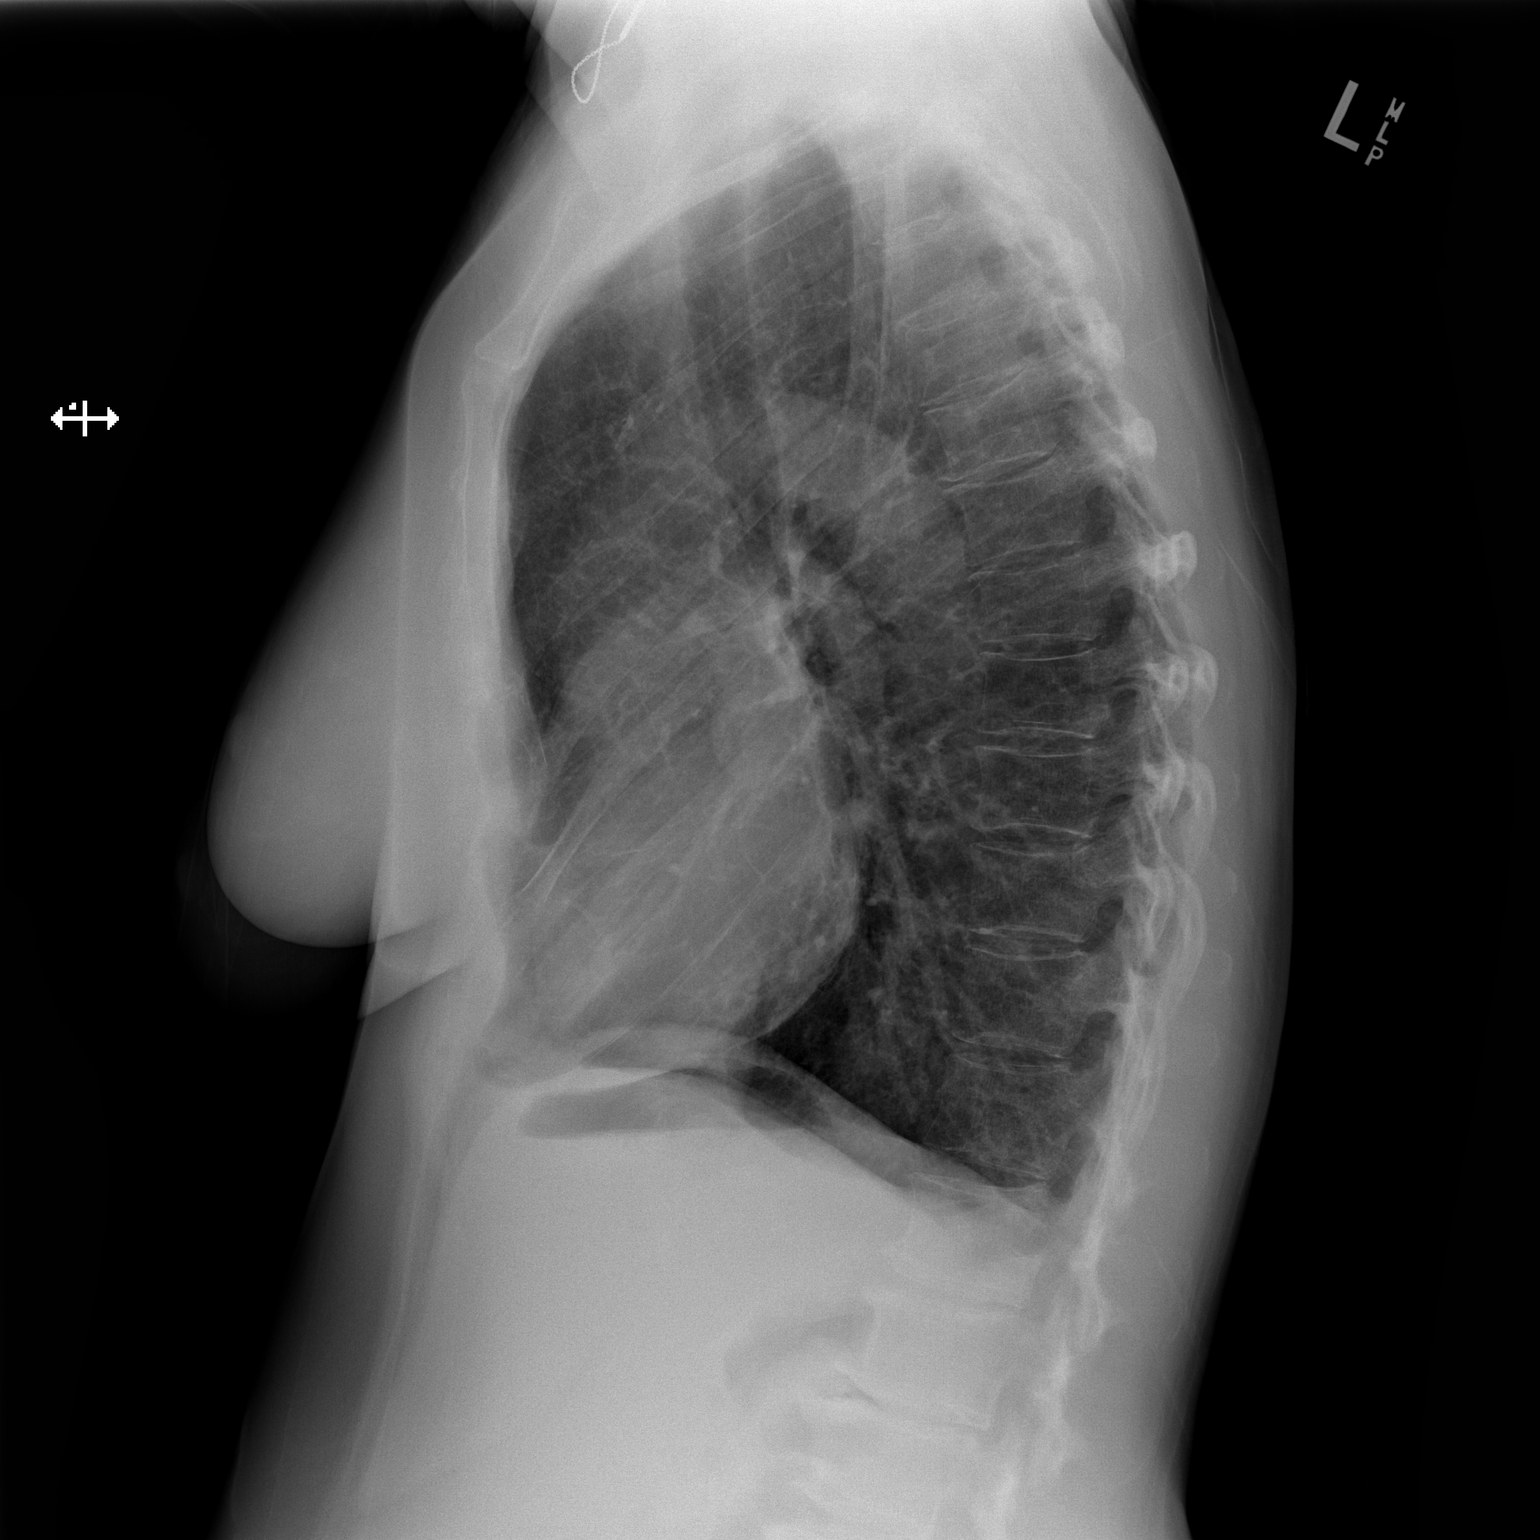

[2 of 2 positions shown; findings below may reference images not displayed]

FINDINGS: Stable cardiomediastinal silhouette with normal heart size. No
pneumothorax. No pleural effusion. Hyperinflated lungs and
emphysema. No pulmonary edema. No acute consolidative airspace
disease.
IMPRESSION: 1. No acute cardiopulmonary disease.
2. Hyperinflated lungs and emphysema, suggesting COPD.

## 2019-08-21 DIAGNOSIS — M19042 Primary osteoarthritis, left hand: Secondary | ICD-10-CM

## 2019-08-21 DIAGNOSIS — I10 Essential (primary) hypertension: Secondary | ICD-10-CM | POA: Diagnosis not present

## 2019-08-21 DIAGNOSIS — E890 Postprocedural hypothyroidism: Secondary | ICD-10-CM | POA: Diagnosis not present

## 2019-08-21 DIAGNOSIS — M19041 Primary osteoarthritis, right hand: Secondary | ICD-10-CM

## 2019-08-21 HISTORY — DX: Primary osteoarthritis, right hand: M19.042

## 2019-08-21 HISTORY — DX: Primary osteoarthritis, right hand: M19.041

## 2019-09-21 ENCOUNTER — Other Ambulatory Visit: Payer: Self-pay | Admitting: Family Medicine

## 2019-09-21 DIAGNOSIS — Z1231 Encounter for screening mammogram for malignant neoplasm of breast: Secondary | ICD-10-CM

## 2019-11-20 ENCOUNTER — Ambulatory Visit
Admission: RE | Admit: 2019-11-20 | Discharge: 2019-11-20 | Disposition: A | Discharge status: Home / Self Care | Payer: BC Managed Care – PPO | Source: Ambulatory Visit | Attending: Family Medicine | Admitting: Family Medicine

## 2019-11-20 ENCOUNTER — Other Ambulatory Visit: Payer: Self-pay

## 2019-11-20 DIAGNOSIS — Z1231 Encounter for screening mammogram for malignant neoplasm of breast: Secondary | ICD-10-CM

## 2019-11-24 ENCOUNTER — Other Ambulatory Visit: Payer: Self-pay | Admitting: Family Medicine

## 2019-11-24 DIAGNOSIS — R928 Other abnormal and inconclusive findings on diagnostic imaging of breast: Secondary | ICD-10-CM

## 2019-12-04 ENCOUNTER — Ambulatory Visit
Admission: RE | Admit: 2019-12-04 | Discharge: 2019-12-04 | Disposition: A | Discharge status: Home / Self Care | Payer: BC Managed Care – PPO | Source: Ambulatory Visit | Attending: Family Medicine | Admitting: Family Medicine

## 2019-12-04 ENCOUNTER — Other Ambulatory Visit: Payer: Self-pay | Admitting: Family Medicine

## 2019-12-04 ENCOUNTER — Other Ambulatory Visit: Payer: Self-pay

## 2019-12-04 DIAGNOSIS — R928 Other abnormal and inconclusive findings on diagnostic imaging of breast: Secondary | ICD-10-CM | POA: Diagnosis not present

## 2019-12-04 DIAGNOSIS — N6489 Other specified disorders of breast: Secondary | ICD-10-CM | POA: Diagnosis not present

## 2019-12-04 DIAGNOSIS — N6320 Unspecified lump in the left breast, unspecified quadrant: Secondary | ICD-10-CM

## 2019-12-11 DIAGNOSIS — M19042 Primary osteoarthritis, left hand: Secondary | ICD-10-CM | POA: Diagnosis not present

## 2019-12-11 DIAGNOSIS — I10 Essential (primary) hypertension: Secondary | ICD-10-CM | POA: Diagnosis not present

## 2019-12-11 DIAGNOSIS — E890 Postprocedural hypothyroidism: Secondary | ICD-10-CM | POA: Diagnosis not present

## 2019-12-18 ENCOUNTER — Ambulatory Visit
Admission: RE | Admit: 2019-12-18 | Discharge: 2019-12-18 | Disposition: A | Discharge status: Home / Self Care | Payer: BC Managed Care – PPO | Source: Ambulatory Visit | Attending: Family Medicine | Admitting: Family Medicine

## 2019-12-18 ENCOUNTER — Other Ambulatory Visit: Payer: Self-pay

## 2019-12-18 DIAGNOSIS — N6022 Fibroadenosis of left breast: Secondary | ICD-10-CM | POA: Diagnosis not present

## 2019-12-18 DIAGNOSIS — N6320 Unspecified lump in the left breast, unspecified quadrant: Secondary | ICD-10-CM

## 2019-12-18 DIAGNOSIS — N6321 Unspecified lump in the left breast, upper outer quadrant: Secondary | ICD-10-CM | POA: Diagnosis not present

## 2019-12-18 DIAGNOSIS — R928 Other abnormal and inconclusive findings on diagnostic imaging of breast: Secondary | ICD-10-CM

## 2019-12-28 NOTE — Progress Notes (Signed)
Cardiology Office Note:    Date:  01/07/2020   ID:  Ann Chan, DOB July 02, 1956, MRN [id number]  PCP:  System, Pcp Not In  Cardiologist:  No primary care provider on file.  Electrophysiologist:  None   Referring MD: Marda Stalker, PA-C   Chief Complaint  Patient presents with  . Hypertension    History of Present Illness:    Ann Chan is a 64 y.o. female with a hx of hypertension, Graves' disease now with post ablative hypothyroidism, tobacco use, mitral valve prolapse who is referred by Marda Stalker, PA for evaluation of hypertension.  She was previously on valsartan/amlodipine but stopped due to nausea.  She developed insomnia on metoprolol.  Had headaches with Imdur.  Developed cough with lisinopril.  She was recently started on chlorthalidone on 3/5 but has stopped taking.  Currently only taking clonidine for hypertension.  Reports that she does not exercise, but her work involves a lot of exertion.  Denies any exertional chest pain, does have some dyspnea with exertion.  Reports significant family history, with father having MI in his 15s and died of MI in 90s, and brother has had MI.  Patient had a normal cath in 2010.   Past Medical History:  Diagnosis Date  . Arthritis   . Thyroid disease     No past surgical history on file.  Current Medications: Current Meds  Medication Sig  . cloNIDine (CATAPRES) 0.1 MG tablet Take 0.1 mg by mouth 2 (two) times daily.  Marland Kitchen levothyroxine (SYNTHROID) 112 MCG tablet levothyroxine 112 mcg tablet     Allergies:   Sulfamethoxazole   Social History   Socioeconomic History  . Marital status: Single    Spouse name: Not on file  . Number of children: Not on file  . Years of education: Not on file  . Highest education level: Not on file  Occupational History  . Not on file  Tobacco Use  . Smoking status: Former Smoker    Types: Cigarettes    Quit date: 05/07/2013    Years since quitting: 6.6  . Smokeless tobacco: Never  Used  Substance and Sexual Activity  . Alcohol use: Yes  . Drug use: No  . Sexual activity: Not on file  Other Topics Concern  . Not on file  Social History Narrative  . Not on file   Social Determinants of Health   Financial Resource Strain:   . Difficulty of Paying Living Expenses:   Food Insecurity:   . Worried About Charity fundraiser in the Last Year:   . Arboriculturist in the Last Year:   Transportation Needs:   . Film/video editor (Medical):   Marland Kitchen Lack of Transportation (Non-Medical):   Physical Activity:   . Days of Exercise per Week:   . Minutes of Exercise per Session:   Stress:   . Feeling of Stress :   Social Connections:   . Frequency of Communication with Friends and Family:   . Frequency of Social Gatherings with Friends and Family:   . Attends Religious Services:   . Active Member of Clubs or Organizations:   . Attends Archivist Meetings:   Marland Kitchen Marital Status:      Family History: The patient's family history includes Heart disease in her father.  ROS:   Please see the history of present illness.     All other systems reviewed and are negative.  EKGs/Labs/Other Studies Reviewed:    The following studies  were reviewed today:   EKG:  EKG is  ordered today.  The ekg ordered today demonstrates normal sinus rhythm, rate 73, incomplete right bundle branch block, T wave inversions in leads III, aVF, V1/2  Recent Labs: No results found for requested labs within last 8760 hours.  Recent Lipid Panel No results found for: CHOL, TRIG, HDL, CHOLHDL, VLDL, LDLCALC, LDLDIRECT  Physical Exam:    VS:  BP (!) 140/92   Pulse 73   Temp (!) 96.3 F (35.7 C)   Ht 5\' 5"  (1.651 m)   Wt 141 lb (64 kg)   SpO2 98%   BMI 23.46 kg/m     Wt Readings from Last 3 Encounters:  01/01/20 141 lb (64 kg)  05/10/14 134 lb (60.8 kg)  05/07/14 133 lb (60.3 kg)     GEN: Well nourished, well developed in no acute distress HEENT: Normal NECK: No JVD; No  carotid bruits LYMPHATICS: No lymphadenopathy CARDIAC: RRR, no murmurs, rubs, gallops RESPIRATORY:  Clear to auscultation without rales, wheezing or rhonchi  ABDOMEN: Soft, non-tender, non-distended MUSCULOSKELETAL:  No edema; No deformity  SKIN: Warm and dry NEUROLOGIC:  Alert and oriented x 3 PSYCHIATRIC:  Normal affect   ASSESSMENT:    1. Hypertension, unspecified type   2. Hypothyroidism, unspecified type   3. MVP (mitral valve prolapse)    PLAN:     Hypertension: On clonidine 0.1 mg twice daily.  BP remains elevated.  Has extensive allergies to antihypertensives.  Reports that tolerated amlodipine for a while but had nausea on amlodipine/valsartan.  Will try amlodipine alone, will start 5 mg daily.    Mitral valve prolapse: Reported history, recommended TTE for further evaluation but patient would prefer to hold off at this time  Graves' disease now with post ablative hypothyroidism: Patient requesting referral to endocrinology, will place referral  RTC in 3 months  Medication Adjustments/Labs and Tests Ordered: Current medicines are reviewed at length with the patient today.  Concerns regarding medicines are outlined above.  Orders Placed This Encounter  Procedures  . Ambulatory referral to Endocrinology  . EKG 12-Lead   Meds ordered this encounter  Medications  . amLODipine (NORVASC) 5 MG tablet    Sig: Take 1 tablet (5 mg total) by mouth daily.    Dispense:  90 tablet    Refill:  3    Patient Instructions  Medication Instructions:  START amlodipine 5 mg daily  *If you need a refill on your cardiac medications before your next appointment, please call your pharmacy*  Lab Work: NONE  Testing/Procedures: NONE  Follow-Up: At 05/09/14, you and your health needs are our priority.  As part of our continuing mission to provide you with exceptional heart care, we have created designated Provider Care Teams.  These Care Teams include your primary  Cardiologist (physician) and Advanced Practice Providers (APPs -  Physician Assistants and Nurse Practitioners) who all work together to provide you with the care you need, when you need it.  We recommend signing up for the patient portal called "MyChart".  Sign up information is provided on this After Visit Summary.  MyChart is used to connect with patients for Virtual Visits (Telemedicine).  Patients are able to view lab/test results, encounter notes, upcoming appointments, etc.  Non-urgent messages can be sent to your provider as well.   To learn more about what you can do with MyChart, go to [employer].    Your next appointment:   3 month(s)  The format for  your next appointment:   In Person  Provider:   Epifanio Lesches, MD    You have been referred to: Endocrinology  Other Instructions Please check your blood pressure daily and write it down-call in 2 weeks to report readings for Dr. Bjorn Pippin to review.      Signed, Little Ishikawa, MD  01/07/2020 9:57 PM    [facility]

## 2020-01-01 ENCOUNTER — Other Ambulatory Visit: Payer: Self-pay

## 2020-01-01 ENCOUNTER — Ambulatory Visit (INDEPENDENT_AMBULATORY_CARE_PROVIDER_SITE_OTHER): Payer: BC Managed Care – PPO | Admitting: Cardiology

## 2020-01-01 ENCOUNTER — Encounter: Payer: Self-pay | Admitting: Cardiology

## 2020-01-01 VITALS — BP 140/92 | HR 73 | Temp 96.3°F | Ht 65.0 in | Wt 141.0 lb

## 2020-01-01 DIAGNOSIS — E039 Hypothyroidism, unspecified: Secondary | ICD-10-CM

## 2020-01-01 DIAGNOSIS — I10 Essential (primary) hypertension: Secondary | ICD-10-CM

## 2020-01-01 DIAGNOSIS — I341 Nonrheumatic mitral (valve) prolapse: Secondary | ICD-10-CM

## 2020-01-01 MED ORDER — AMLODIPINE BESYLATE 5 MG PO TABS: 5.0000 mg | ORAL_TABLET | Freq: Every day | ORAL | 3 refills | Status: DC

## 2020-01-01 NOTE — Patient Instructions (Signed)
Medication Instructions:  START amlodipine 5 mg daily  *If you need a refill on your cardiac medications before your next appointment, please call your pharmacy*  Lab Work: NONE  Testing/Procedures: NONE  Follow-Up: At [employer], you and your health needs are our priority.  As part of our continuing mission to provide you with exceptional heart care, we have created designated Provider Care Teams.  These Care Teams include your primary Cardiologist (physician) and Advanced Practice Providers (APPs -  Physician Assistants and Nurse Practitioners) who all work together to provide you with the care you need, when you need it.  We recommend signing up for the patient portal called "MyChart".  Sign up information is provided on this After Visit Summary.  MyChart is used to connect with patients for Virtual Visits (Telemedicine).  Patients are able to view lab/test results, encounter notes, upcoming appointments, etc.  Non-urgent messages can be sent to your provider as well.   To learn more about what you can do with MyChart, go to [email or web address].    Your next appointment:   3 month(s)  The format for your next appointment:   In Person  Provider:   Epifanio Lesches, MD    You have been referred to: Endocrinology  Other Instructions Please check your blood pressure daily and write it down-call in 2 weeks to report readings for Dr. Bjorn Pippin to review.

## 2020-01-06 ENCOUNTER — Ambulatory Visit: Payer: BC Managed Care – PPO | Admitting: Internal Medicine

## 2020-02-05 DIAGNOSIS — M79602 Pain in left arm: Secondary | ICD-10-CM | POA: Diagnosis not present

## 2020-02-19 ENCOUNTER — Other Ambulatory Visit: Payer: Self-pay

## 2020-02-19 ENCOUNTER — Encounter: Payer: Self-pay | Admitting: Endocrinology

## 2020-02-19 ENCOUNTER — Ambulatory Visit (INDEPENDENT_AMBULATORY_CARE_PROVIDER_SITE_OTHER): Payer: BC Managed Care – PPO | Admitting: Endocrinology

## 2020-02-19 DIAGNOSIS — E039 Hypothyroidism, unspecified: Secondary | ICD-10-CM | POA: Diagnosis not present

## 2020-02-19 MED ORDER — LEVOTHYROXINE SODIUM 112 MCG PO TABS: 112.0000 ug | ORAL_TABLET | Freq: Every day | ORAL | 3 refills | Status: AC

## 2020-02-19 NOTE — Patient Instructions (Addendum)
Your blood pressure is high today.  Please see your primary care provider soon, to have it rechecked. Please continue the same levothyroxine. Please come back for a follow-up appointment in 3-4 months When you come back, we'll check the thyroid and B-12.

## 2020-02-19 NOTE — Progress Notes (Signed)
Subjective:    Patient ID: Ann Chan, female    DOB: 1956/07/14, 64 y.o.   MRN: [id number]  HPI Pt is referred by Dr Bjorn Pippin, for hypothyroidism.  Pt reports she had RAI for hyperthyroidism in approx 1986.  she has been on prescribed thyroid hormone therapy since then.  she has never taken kelp or any other type of non-prescribed thyroid product.  She has never had neck surgery.  she has never been on amiodarone or lithium.  Main symptom is arthralgias of both hands.   Past Medical History:  Diagnosis Date  . Arthritis   . Thyroid disease     No past surgical history on file.  Social History   Socioeconomic History  . Marital status: Single    Spouse name: Not on file  . Number of children: Not on file  . Years of education: Not on file  . Highest education level: Not on file  Occupational History  . Not on file  Tobacco Use  . Smoking status: Former Smoker    Types: Cigarettes    Quit date: 05/07/2013    Years since quitting: 6.7  . Smokeless tobacco: Never Used  Substance and Sexual Activity  . Alcohol use: Yes  . Drug use: No  . Sexual activity: Not on file  Other Topics Concern  . Not on file  Social History Narrative  . Not on file   Social Determinants of Health   Financial Resource Strain:   . Difficulty of Paying Living Expenses:   Food Insecurity:   . Worried About Programme researcher, broadcasting/film/video in the Last Year:   . Barista in the Last Year:   Transportation Needs:   . Freight forwarder (Medical):   Marland Kitchen Lack of Transportation (Non-Medical):   Physical Activity:   . Days of Exercise per Week:   . Minutes of Exercise per Session:   Stress:   . Feeling of Stress :   Social Connections:   . Frequency of Communication with Friends and Family:   . Frequency of Social Gatherings with Friends and Family:   . Attends Religious Services:   . Active Member of Clubs or Organizations:   . Attends Banker Meetings:   Marland Kitchen Marital Status:     Intimate Partner Violence:   . Fear of Current or Ex-Partner:   . Emotionally Abused:   Marland Kitchen Physically Abused:   . Sexually Abused:     Current Outpatient Medications on File Prior to Visit  Medication Sig Dispense Refill  . Acetaminophen-Codeine 300-15 MG TABS Take 1 tablet by mouth every 6 (six) hours as needed.    Marland Kitchen amLODipine (NORVASC) 5 MG tablet Take 1 tablet (5 mg total) by mouth daily. 90 tablet 3  . cloNIDine (CATAPRES) 0.1 MG tablet Take 0.1 mg by mouth 2 (two) times daily.     No current facility-administered medications on file prior to visit.    Allergies  Allergen Reactions  . Sulfamethoxazole Nausea And Vomiting    Family History  Problem Relation Age of Onset  . Heart disease Father   . Thyroid disease Maternal Grandfather     BP (!) 148/98   Pulse 82   Wt 138 lb (62.6 kg)   SpO2 98%   BMI 22.96 kg/m    Review of Systems denies depression, muscle cramps, weight gain, memory loss, constipation, and cold intolerance.  She has slight numbness of the toes.  She has dry skin.  Objective:   Physical Exam VS: see vs page GEN: no distress HEAD: head: no deformity.  bilat hearing aids.   eyes: no periorbital swelling; slight bilat proptosis.  external nose and ears are normal NECK: supple, thyroid is not enlarged CHEST WALL: no deformity LUNGS: clear to auscultation CV: reg rate and rhythm, no murmur.  MUSCULOSKELETAL: muscle bulk and strength are grossly normal.  no obvious joint swelling.  gait is normal and steady EXTEMITIES: OA changes on the fingers.  no leg edema. PULSES: no carotid bruit NEURO:  cn 2-12 grossly intact, except for hearing loss   readily moves all 4's.  sensation is intact to touch on all 4's SKIN:  Normal texture and temperature.  No rash or suspicious lesion is visible.   NODES:  None palpable at the neck PSYCH: alert, well-oriented.  Does not appear anxious nor depressed.    [place] (2015): Small residual thyroid gland without  focal nodules.  outside test results are reviewed: TSH=5.1  I have reviewed outside records, and summarized: Pt was noted to have elevated TSH, and referred here.  Main prob addressed was HTN.       Assessment & Plan:  Chronic primary hypothyroidism, new to me.  she declines to recheck TSH today, due to high deductible.   HTN: is gain noted today.   Numbness: she is at risk for B-12 def.  Patient Instructions  Your blood pressure is high today.  Please see your primary care provider soon, to have it rechecked. Please continue the same levothyroxine. Please come back for a follow-up appointment in 3-4 months When you come back, we'll check the thyroid and B-12.

## 2020-02-20 DIAGNOSIS — E039 Hypothyroidism, unspecified: Secondary | ICD-10-CM | POA: Insufficient documentation

## 2020-03-25 ENCOUNTER — Ambulatory Visit: Payer: BC Managed Care – PPO | Admitting: Cardiology

## 2020-03-25 ENCOUNTER — Other Ambulatory Visit: Payer: Self-pay

## 2020-03-25 ENCOUNTER — Ambulatory Visit (INDEPENDENT_AMBULATORY_CARE_PROVIDER_SITE_OTHER): Payer: BC Managed Care – PPO | Admitting: Surgical

## 2020-03-25 ENCOUNTER — Encounter: Payer: Self-pay | Admitting: Surgical

## 2020-03-25 ENCOUNTER — Ambulatory Visit: Payer: BC Managed Care – PPO | Admitting: Orthopedic Surgery

## 2020-03-25 ENCOUNTER — Telehealth: Payer: Self-pay | Admitting: Orthopedic Surgery

## 2020-03-25 ENCOUNTER — Ambulatory Visit: Payer: Self-pay

## 2020-03-25 DIAGNOSIS — M25512 Pain in left shoulder: Secondary | ICD-10-CM

## 2020-03-25 DIAGNOSIS — M7502 Adhesive capsulitis of left shoulder: Secondary | ICD-10-CM

## 2020-03-25 DIAGNOSIS — M7501 Adhesive capsulitis of right shoulder: Secondary | ICD-10-CM

## 2020-03-25 DIAGNOSIS — M1712 Unilateral primary osteoarthritis, left knee: Secondary | ICD-10-CM

## 2020-03-25 DIAGNOSIS — M25562 Pain in left knee: Secondary | ICD-10-CM | POA: Diagnosis not present

## 2020-03-25 MED ORDER — LIDOCAINE HCL 1 % IJ SOLN
5.0000 mL | INTRAMUSCULAR | Status: AC | PRN
  Administered 2020-03-25: 5 mL

## 2020-03-25 MED ORDER — BUPIVACAINE HCL 0.25 % IJ SOLN
4.0000 mL | INTRAMUSCULAR | Status: AC | PRN
  Administered 2020-03-25: 4 mL via INTRA_ARTICULAR

## 2020-03-25 MED ORDER — METHYLPREDNISOLONE ACETATE 40 MG/ML IJ SUSP
40.0000 mg | INTRAMUSCULAR | Status: AC | PRN
  Administered 2020-03-25: 40 mg via INTRA_ARTICULAR

## 2020-03-25 MED ORDER — BUPIVACAINE HCL 0.5 % IJ SOLN
9.0000 mL | INTRAMUSCULAR | Status: AC | PRN
  Administered 2020-03-25: 9 mL via INTRA_ARTICULAR

## 2020-03-25 NOTE — Progress Notes (Signed)
Office Visit Note   Patient: Ann Chan           Date of Birth: [birth year]           MRN: [id number] Visit Date: 03/25/2020 Requested by: No referring provider defined for this encounter. PCP: System, Pcp Not In  Subjective: Chief Complaint  Patient presents with  . Left Shoulder - Pain  . Right Shoulder - Pain  . Left Knee - Pain    HPI: Ann Chan is a 64 y.o. female who presents to the office complaining of bilateral shoulder pain and left knee pain.  She notes bilateral shoulder pain, left greater than right.  This is been ongoing for the last month and steadily worsening without any history of injury.  She localizes pain to the shoulder diffusely with radiation down into the fingertips.  She denies any neck pain or subjective weakness.  She does note numbness and tingling in her fingers with onset every day but her main complaint is pain.  She has had increased stiffness in her shoulders and notes that it is difficult to put her bra on in the morning now.  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  She notes no history of diabetes but does report history of hypothyroidism.  Patient also complains of left knee pain with 1 month duration.  Denies any injury leading to onset of pain.  Denies any groin pain or numbness/tingling but does note occasional radicular pain down her leg.  The knee feels like it wants to give out at times though she has had no instability episodes.  She has no mechanical symptoms.  She has no history of surgery on her shoulders or knee..                ROS:  All systems reviewed are negative as they relate to the chief complaint within the history of present illness.  Patient denies fevers or chills.  Assessment & Plan: Visit Diagnoses:  1. Left knee pain, unspecified chronicity   2. Left shoulder pain, unspecified chronicity   3. Adhesive capsulitis of left shoulder   4. Unilateral primary osteoarthritis, left knee   5. Adhesive  capsulitis of right shoulder     Plan: Patient is a 64 year old female who presents complaining of bilateral shoulder pain, left greater than right as well as left knee pain.  All symptoms have been affecting her over the last month and steadily worsening.  She has no history of injury.  Her main complaint is pain and she has no severe weakness on exam today.  She does have passive range of motion of the shoulders, especially with the left with reduced range of motion, worse on the left side.  With her insidious onset of shoulder stiffness and pain and history of hypothyroidism, impression is adhesive capsulitis.  Radiographs taken today were negative for any acute pathology to explain her pain.  A glenohumeral cortisone injection was administered today and patient tolerated the procedure well.  A home exercise program was given to the patient and recommended that she do this every day or every other day to improve her shoulder stiffness and pain.  Patient understands.  Regarding the left knee, she has had no injury.  She has tenderness that is most severe over the medial joint line.  She has mild to moderate joint space narrowing of the medial compartment on radiographs taken today.  No effusion is present.  A left knee cortisone injection was  administered today.  She tolerated procedure well.  Plan for patient to follow-up in 6 weeks for clinical recheck regarding the left shoulder and left knee as well as the radicular nature of some of her shoulder pain with numbness and tingling.  Due to patient's financial situation, only proceeded with the most troublesome symptoms today with work-up.  Plan for reevaluation with potential work-up of her cervical spine in 6 weeks.  Recommended she return sooner if symptoms worsen.  Patient agreed with plan.  Follow-up in 6 weeks.  Follow-Up Instructions: No follow-ups on file.   Orders:  Orders Placed This Encounter  Procedures  . XR Shoulder Left  . XR KNEE 3  VIEW LEFT   No orders of the defined types were placed in this encounter.     Procedures: Large Joint Inj: L knee on 03/25/2020 12:26 PM Indications: diagnostic evaluation, joint swelling and pain Details: 18 G 1.5 in needle, superolateral approach  Arthrogram: No  Medications: 5 mL lidocaine 1 %; 40 mg methylPREDNISolone acetate 40 MG/ML; 4 mL bupivacaine 0.25 % Outcome: tolerated well, no immediate complications Procedure, treatment alternatives, risks and benefits explained, specific risks discussed. Consent was given by the patient. Immediately prior to procedure a time out was called to verify the correct patient, procedure, equipment, support staff and site/side marked as required. Patient was prepped and draped in the usual sterile fashion.   Large Joint Inj: L glenohumeral on 03/25/2020 12:26 PM Indications: diagnostic evaluation and pain Details: 18 G 1.5 in needle, posterior approach  Arthrogram: No  Medications: 9 mL bupivacaine 0.5 %; 40 mg methylPREDNISolone acetate 40 MG/ML; 5 mL lidocaine 1 % Outcome: tolerated well, no immediate complications Procedure, treatment alternatives, risks and benefits explained, specific risks discussed. Consent was given by the patient. Immediately prior to procedure a time out was called to verify the correct patient, procedure, equipment, support staff and site/side marked as required. Patient was prepped and draped in the usual sterile fashion.       Clinical Data: No additional findings.  Objective: Vital Signs: There were no vitals taken for this visit.  Physical Exam:  Constitutional: Patient appears well-developed HEENT:  Head: Normocephalic Eyes:EOM are normal Neck: Normal range of motion Cardiovascular: Normal rate Pulmonary/chest: Effort normal Neurologic: Patient is alert Skin: Skin is warm Psychiatric: Patient has normal mood and affect  Ortho Exam:  Left shoulder Exam 15 degrees external rotation, 90 degrees  abduction, 110 degrees forward flexion Pain with passive range of motion of the left shoulder, especially at the ends of range of motion No significant tenderness to palpation over the [facility] joint.  Mild tenderness to palpation over the bicipital groove Good subscapularis, supraspinatus, and infraspinatus strength 5/5 grip strength, forearm pronation/supination, and bicep strength No tenderness to palpation throughout the axial cervical spine or paraspinal musculature.  Right shoulder Exam 50 degrees external rotation, 100 degrees abduction, 160 degrees forward flexion Pain with passive range of motion No TTP over the [facility] joint or bicipital groove Good subscapularis, supraspinatus, and infraspinatus strength 5/5 grip strength, forearm pronation/supination, and bicep strength  Left knee Exam No effusion Tender to palpation over the medial and lateral joint lines as well as the pes anserine bursa Extensor mechanism intact No TTP over the quad tendon, patellar tendon, patella, tibial tubercle, LCL/MCL insertions Stable to varus/valgus stresses.  Stable to anterior/posterior drawer Extension to 0 degrees Flexion > 90 degrees  Specialty Comments:  No specialty comments available.  Imaging: No results found.   PMFS History:  Patient Active Problem List   Diagnosis Date Noted  . Hypothyroidism 02/20/2020   Past Medical History:  Diagnosis Date  . Arthritis   . Thyroid disease     Family History  Problem Relation Age of Onset  . Heart disease Father   . Thyroid disease Maternal Grandfather     No past surgical history on file. Social History   Occupational History  . Not on file  Tobacco Use  . Smoking status: Former Smoker    Types: Cigarettes    Quit date: 05/07/2013    Years since quitting: 6.8  . Smokeless tobacco: Never Used  Substance and Sexual Activity  . Alcohol use: Yes  . Drug use: No  . Sexual activity: Not on file

## 2020-03-25 NOTE — Telephone Encounter (Signed)
Message sent in error

## 2020-04-29 DIAGNOSIS — M19049 Primary osteoarthritis, unspecified hand: Secondary | ICD-10-CM | POA: Diagnosis not present

## 2020-04-29 DIAGNOSIS — Z1331 Encounter for screening for depression: Secondary | ICD-10-CM | POA: Diagnosis not present

## 2020-04-29 DIAGNOSIS — Z6823 Body mass index (BMI) 23.0-23.9, adult: Secondary | ICD-10-CM | POA: Diagnosis not present

## 2020-04-29 DIAGNOSIS — E039 Hypothyroidism, unspecified: Secondary | ICD-10-CM | POA: Diagnosis not present

## 2020-05-06 ENCOUNTER — Ambulatory Visit: Payer: BC Managed Care – PPO | Admitting: Orthopedic Surgery

## 2020-05-27 ENCOUNTER — Ambulatory Visit: Payer: BC Managed Care – PPO | Admitting: Endocrinology

## 2020-09-30 IMAGING — US US BREAST*L* LIMITED INC AXILLA
1 series · 9 of 9 positions shown · non-contrast
Comparison: Previous exam(s).

CLINICAL DATA: Patient was called back from baseline screening
mammogram for a left breast mass.

EXAM:
DIGITAL DIAGNOSTIC LEFT MAMMOGRAM WITH TOMO
ULTRASOUND LEFT BREAST

[Series 1: us breast*left* limited inc axilla · 0.06mm/px · 9 of 9 slices shown]
[im 1/9]
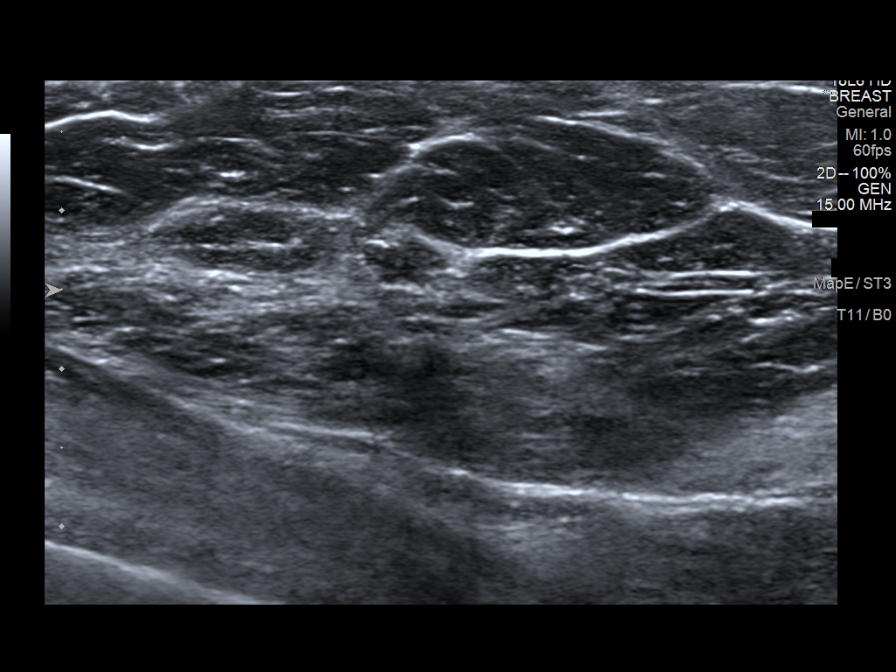
[im 2/9]
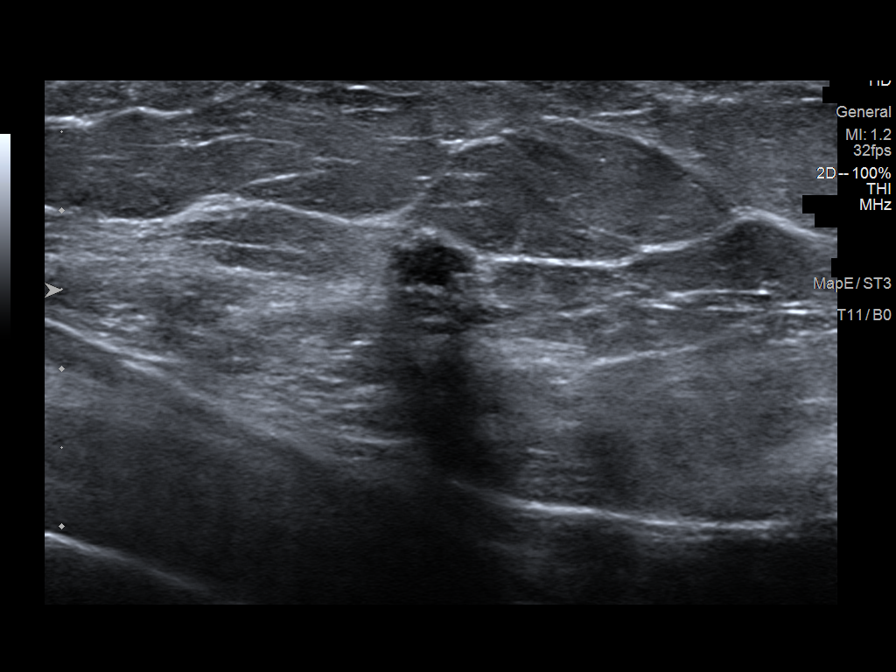
[im 3/9]
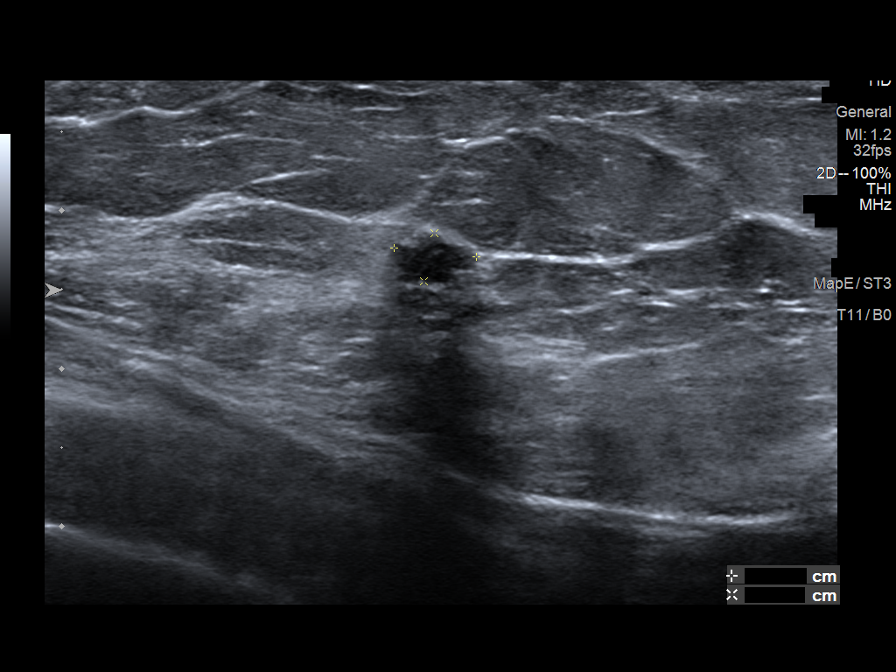
[im 4/9]
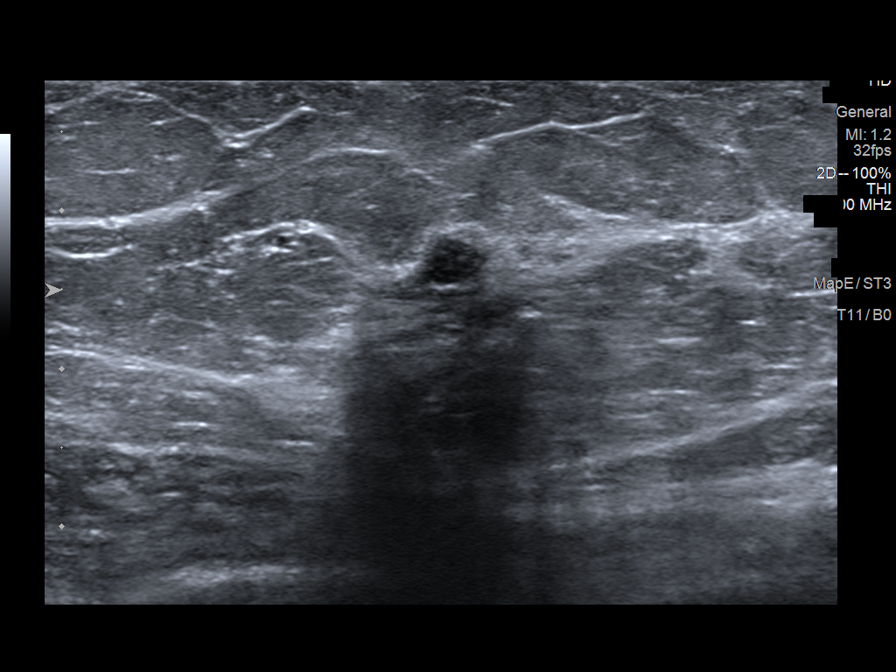
[im 5/9]
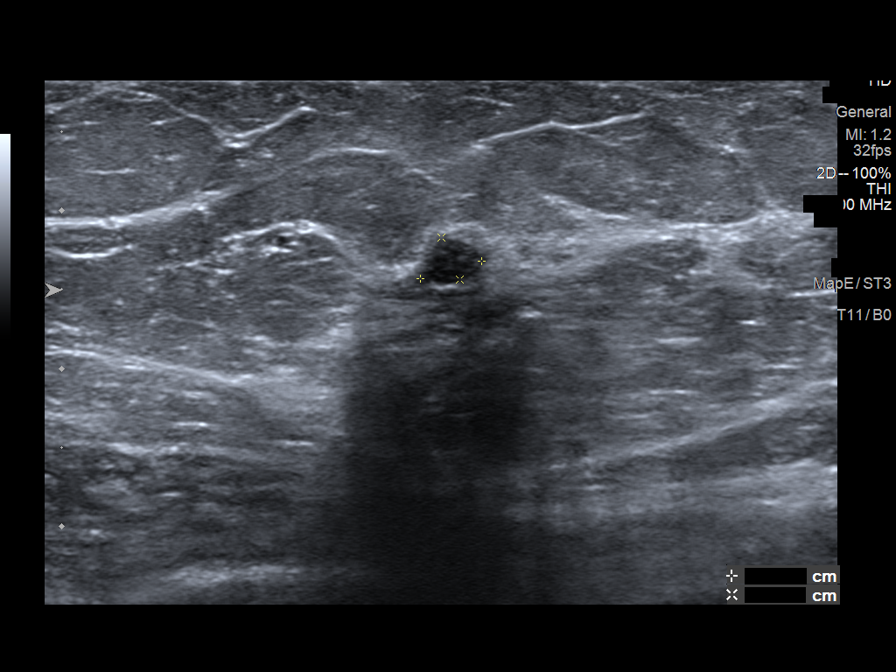
[im 6/9]
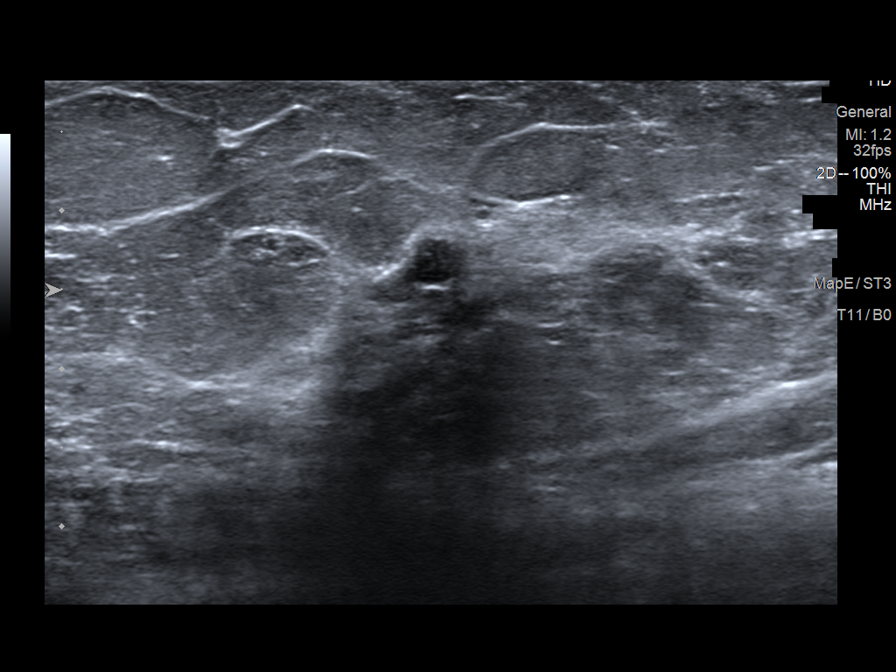
[im 7/9]
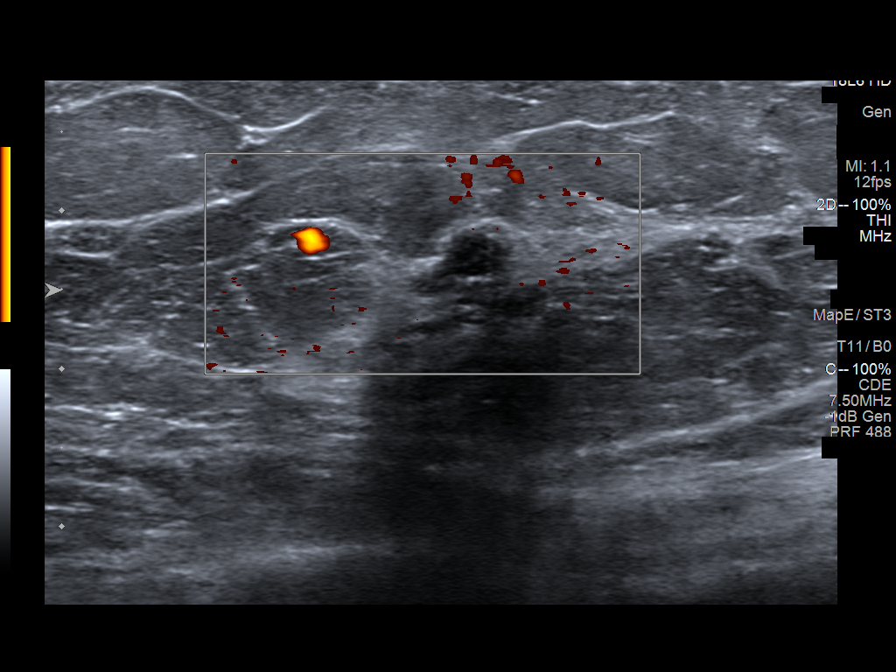
[im 8/9]
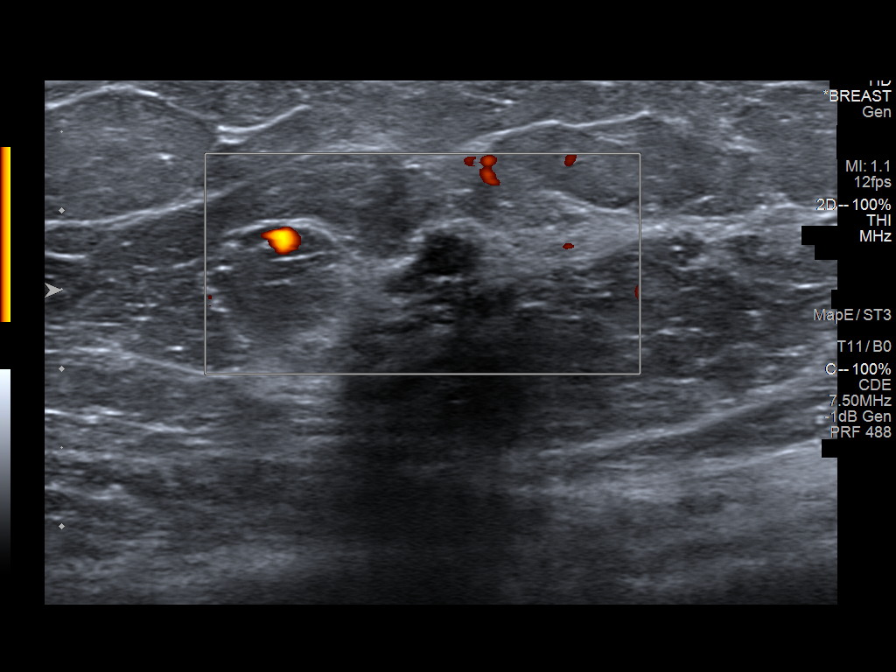
[im 9/9]
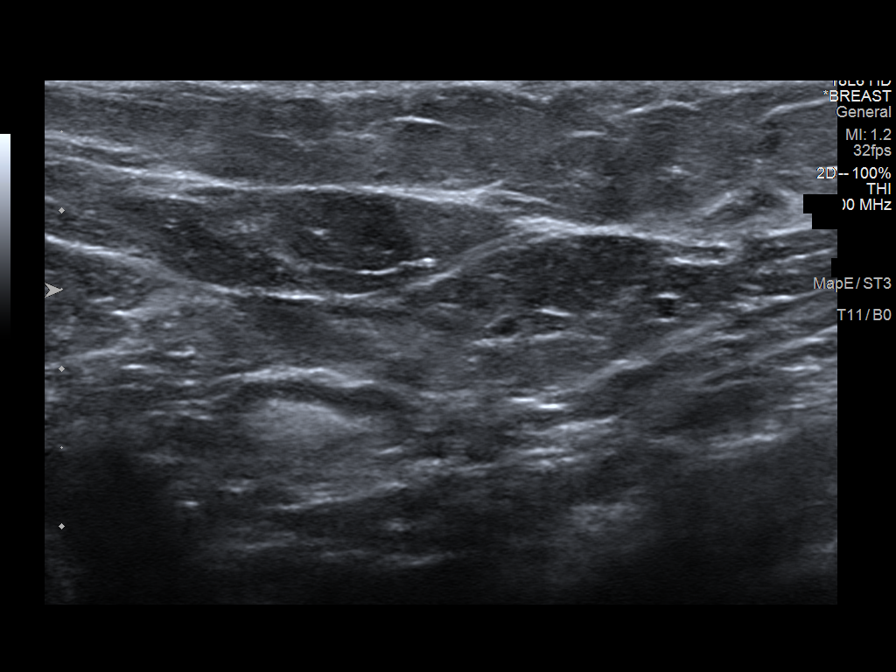

[9 of 9 positions shown; findings below may reference images not displayed]

ACR Breast Density Category b: There are scattered areas of
fibroglandular density.
FINDINGS: Additional imaging of the left breast was performed. There is
persistence of a 7 mm mass in the upper-outer quadrant of the
breast. There are no malignant type microcalcifications.

On physical exam, I do not palpate a mass in the upper-outer
quadrant of the left breast.

Targeted ultrasound is performed, showing there is a hypoechoic mass
was irregular borders in the left breast at 2 o'clock 7 cm from the
nipple measuring 5 x 3 x 4 mm. Sonographic evaluation the left
axilla does not show any enlarged adenopathy.
IMPRESSION: Indeterminate mass in the 2 o'clock region of the left breast.

RECOMMENDATION:
Ultrasound-guided core biopsy of the mass in the 2 o'clock region of
the left breast is recommended.

I have discussed the findings and recommendations with the patient.
If applicable, a reminder letter will be sent to the patient
regarding the next appointment.

BI-RADS CATEGORY  4: Suspicious.

## 2020-10-14 DIAGNOSIS — I10 Essential (primary) hypertension: Secondary | ICD-10-CM | POA: Diagnosis not present

## 2020-10-14 DIAGNOSIS — E890 Postprocedural hypothyroidism: Secondary | ICD-10-CM | POA: Diagnosis not present

## 2020-10-14 DIAGNOSIS — R0982 Postnasal drip: Secondary | ICD-10-CM | POA: Diagnosis not present

## 2020-10-14 DIAGNOSIS — F419 Anxiety disorder, unspecified: Secondary | ICD-10-CM | POA: Diagnosis not present

## 2020-12-27 DIAGNOSIS — R059 Cough, unspecified: Secondary | ICD-10-CM | POA: Diagnosis not present

## 2020-12-27 DIAGNOSIS — Z20822 Contact with and (suspected) exposure to covid-19: Secondary | ICD-10-CM | POA: Diagnosis not present

## 2021-02-17 ENCOUNTER — Other Ambulatory Visit: Payer: Self-pay | Admitting: Cardiology

## 2021-02-17 DIAGNOSIS — Z9289 Personal history of other medical treatment: Secondary | ICD-10-CM | POA: Diagnosis not present

## 2021-02-17 DIAGNOSIS — Z8639 Personal history of other endocrine, nutritional and metabolic disease: Secondary | ICD-10-CM | POA: Diagnosis not present

## 2021-02-17 DIAGNOSIS — K219 Gastro-esophageal reflux disease without esophagitis: Secondary | ICD-10-CM | POA: Diagnosis not present

## 2021-02-17 DIAGNOSIS — J3489 Other specified disorders of nose and nasal sinuses: Secondary | ICD-10-CM | POA: Diagnosis not present

## 2021-04-29 DIAGNOSIS — H02849 Edema of unspecified eye, unspecified eyelid: Secondary | ICD-10-CM | POA: Diagnosis not present

## 2021-04-29 DIAGNOSIS — I10 Essential (primary) hypertension: Secondary | ICD-10-CM | POA: Diagnosis not present

## 2021-04-29 DIAGNOSIS — S0090XA Unspecified superficial injury of unspecified part of head, initial encounter: Secondary | ICD-10-CM | POA: Diagnosis not present

## 2021-06-15 DIAGNOSIS — R252 Cramp and spasm: Secondary | ICD-10-CM | POA: Diagnosis not present

## 2021-06-15 DIAGNOSIS — E890 Postprocedural hypothyroidism: Secondary | ICD-10-CM | POA: Diagnosis not present

## 2021-06-15 DIAGNOSIS — I10 Essential (primary) hypertension: Secondary | ICD-10-CM | POA: Diagnosis not present

## 2021-07-21 DIAGNOSIS — E890 Postprocedural hypothyroidism: Secondary | ICD-10-CM | POA: Diagnosis not present

## 2021-09-08 DIAGNOSIS — E890 Postprocedural hypothyroidism: Secondary | ICD-10-CM | POA: Diagnosis not present

## 2021-12-22 DIAGNOSIS — E890 Postprocedural hypothyroidism: Secondary | ICD-10-CM | POA: Diagnosis not present

## 2021-12-22 DIAGNOSIS — Z720 Tobacco use: Secondary | ICD-10-CM | POA: Diagnosis not present

## 2021-12-22 DIAGNOSIS — I10 Essential (primary) hypertension: Secondary | ICD-10-CM | POA: Diagnosis not present

## 2021-12-22 DIAGNOSIS — M19041 Primary osteoarthritis, right hand: Secondary | ICD-10-CM | POA: Diagnosis not present

## 2022-01-24 DIAGNOSIS — K219 Gastro-esophageal reflux disease without esophagitis: Secondary | ICD-10-CM

## 2022-01-24 HISTORY — DX: Gastro-esophageal reflux disease without esophagitis: K21.9

## 2022-02-21 DIAGNOSIS — K219 Gastro-esophageal reflux disease without esophagitis: Secondary | ICD-10-CM

## 2022-02-21 DIAGNOSIS — R1310 Dysphagia, unspecified: Secondary | ICD-10-CM | POA: Diagnosis not present

## 2022-02-21 HISTORY — DX: Gastro-esophageal reflux disease without esophagitis: K21.9

## 2022-02-22 DIAGNOSIS — E890 Postprocedural hypothyroidism: Secondary | ICD-10-CM | POA: Diagnosis not present

## 2023-03-12 DIAGNOSIS — K219 Gastro-esophageal reflux disease without esophagitis: Secondary | ICD-10-CM | POA: Diagnosis not present

## 2023-03-12 DIAGNOSIS — E890 Postprocedural hypothyroidism: Secondary | ICD-10-CM | POA: Diagnosis not present

## 2023-03-12 DIAGNOSIS — Z6821 Body mass index (BMI) 21.0-21.9, adult: Secondary | ICD-10-CM | POA: Diagnosis not present

## 2023-03-12 DIAGNOSIS — I10 Essential (primary) hypertension: Secondary | ICD-10-CM | POA: Diagnosis not present

## 2023-03-22 ENCOUNTER — Other Ambulatory Visit: Payer: Self-pay | Admitting: Family Medicine

## 2023-03-22 DIAGNOSIS — K219 Gastro-esophageal reflux disease without esophagitis: Secondary | ICD-10-CM | POA: Diagnosis not present

## 2023-03-22 DIAGNOSIS — R1310 Dysphagia, unspecified: Secondary | ICD-10-CM

## 2023-03-25 ENCOUNTER — Ambulatory Visit
Admission: RE | Admit: 2023-03-25 | Discharge: 2023-03-25 | Disposition: A | Discharge status: Home / Self Care | Payer: Medicare HMO | Source: Ambulatory Visit | Attending: Family Medicine | Admitting: Family Medicine

## 2023-03-25 DIAGNOSIS — R1310 Dysphagia, unspecified: Secondary | ICD-10-CM

## 2023-04-22 DIAGNOSIS — J439 Emphysema, unspecified: Secondary | ICD-10-CM

## 2023-04-22 DIAGNOSIS — I700 Atherosclerosis of aorta: Secondary | ICD-10-CM

## 2023-04-22 DIAGNOSIS — R1310 Dysphagia, unspecified: Secondary | ICD-10-CM | POA: Diagnosis not present

## 2023-04-22 DIAGNOSIS — E890 Postprocedural hypothyroidism: Secondary | ICD-10-CM | POA: Diagnosis not present

## 2023-04-22 DIAGNOSIS — I10 Essential (primary) hypertension: Secondary | ICD-10-CM | POA: Diagnosis not present

## 2023-04-22 DIAGNOSIS — Z6821 Body mass index (BMI) 21.0-21.9, adult: Secondary | ICD-10-CM | POA: Diagnosis not present

## 2023-04-22 DIAGNOSIS — R0789 Other chest pain: Secondary | ICD-10-CM | POA: Diagnosis not present

## 2023-04-22 DIAGNOSIS — K219 Gastro-esophageal reflux disease without esophagitis: Secondary | ICD-10-CM | POA: Diagnosis not present

## 2023-04-22 HISTORY — DX: Emphysema, unspecified: J43.9

## 2023-04-22 HISTORY — DX: Atherosclerosis of aorta: I70.0

## 2023-05-22 DIAGNOSIS — K219 Gastro-esophageal reflux disease without esophagitis: Secondary | ICD-10-CM | POA: Diagnosis not present

## 2023-05-22 DIAGNOSIS — R1310 Dysphagia, unspecified: Secondary | ICD-10-CM | POA: Diagnosis not present

## 2023-06-24 ENCOUNTER — Ambulatory Visit (INDEPENDENT_AMBULATORY_CARE_PROVIDER_SITE_OTHER): Payer: Medicare HMO

## 2023-06-24 ENCOUNTER — Ambulatory Visit: Payer: Medicare HMO | Attending: Cardiology | Admitting: Cardiology

## 2023-06-24 ENCOUNTER — Encounter: Payer: Self-pay | Admitting: Cardiology

## 2023-06-24 VITALS — BP 112/74 | HR 90 | Ht 65.0 in | Wt 132.2 lb

## 2023-06-24 DIAGNOSIS — I493 Ventricular premature depolarization: Secondary | ICD-10-CM | POA: Diagnosis not present

## 2023-06-24 DIAGNOSIS — R011 Cardiac murmur, unspecified: Secondary | ICD-10-CM | POA: Diagnosis not present

## 2023-06-24 DIAGNOSIS — R072 Precordial pain: Secondary | ICD-10-CM | POA: Diagnosis not present

## 2023-06-24 DIAGNOSIS — Z8679 Personal history of other diseases of the circulatory system: Secondary | ICD-10-CM

## 2023-06-24 DIAGNOSIS — Z8249 Family history of ischemic heart disease and other diseases of the circulatory system: Secondary | ICD-10-CM

## 2023-06-24 DIAGNOSIS — R000 Tachycardia, unspecified: Secondary | ICD-10-CM

## 2023-06-24 DIAGNOSIS — E039 Hypothyroidism, unspecified: Secondary | ICD-10-CM | POA: Diagnosis not present

## 2023-06-24 NOTE — Progress Notes (Unsigned)
Enrolled for [employer] to mail a ZIO XT long term holter monitor to the patients address on file.  

## 2023-06-24 NOTE — Patient Instructions (Addendum)
Medication Instructions:   Not changes  *If you need a refill on your cardiac medications before your next appointment, please call your pharmacy*   Lab Work:  No needed  If you have labs (blood work) drawn today and your tests are completely normal, you will receive your results only by: MyChart Message (if you have MyChart) OR A paper copy in the mail If you have any lab test that is abnormal or we need to change your treatment, we will call you to review the results.   Testing/Procedures:  Will be schedule at [address] Your physician has requested that you have an echocardiogram. Echocardiography is a painless test that uses sound waves to create images of your heart. It provides your doctor with information about the size and shape of your heart and how well your heart's chambers and valves are working. This procedure takes approximately one hour. There are no restrictions for this procedure. Please do NOT wear cologne, perfume, aftershave, or lotions (deodorant is allowed). Please arrive 15 minutes prior to your appointment time.   And    Will be mailed to your home in 3 to 7 days  Your physician has recommended that you wear a holter monitor 14 Day Zio monitor . Holter monitors are medical devices that record the heart's electrical activity. Doctors most often use these monitors to diagnose arrhythmias. Arrhythmias are problems with the speed or rhythm of the heartbeat. The monitor is a small, portable device. You can wear one while you do your normal daily activities. This is usually used to diagnose what is causing palpitations/syncope (passing out).   If your monitor shows  very frequent PVC"S  will order a Coronary CTA Your physician has requested that you have coronary  CTA. Coronary computed tomography (CT)angiogram  is a special type of CT scan that uses a computer to produce multi-dimensional views of major blood vessels throughout the heart.  CT  angiography, a contrast material is injected through an IV to help visualize the blood vessels  a painless test that uses an x-ray machine to take clear, detailed pictures of your heart arteries .    Follow-Up: At [facility], you and your health needs are our priority.  As part of our continuing mission to provide you with exceptional heart care, we have created designated Provider Care Teams.  These Care Teams include your primary Cardiologist (physician) and Advanced Practice Providers (APPs -  Physician Assistants and Nurse Practitioners) who all work together to provide you with the care you need, when you need it.  We recommend signing up for the patient portal called "MyChart".  Sign up information is provided on this After Visit Summary.  MyChart is used to connect with patients for Virtual Visits (Telemedicine).  Patients are able to view lab/test results, encounter notes, upcoming appointments, etc.  Non-urgent messages can be sent to your provider as well.   To learn more about what you can do with MyChart, go to [email or web address].    Your next appointment:   2 month(s)  The format for your next appointment:   In Person  Provider:   Bryan Lemma, MD     Other Instructions  ZIO XT- Long Term Monitor Instructions  Your physician has requested you wear a ZIO patch monitor for 14 days.  This is a single patch monitor. [employer] supplies one patch monitor per enrollment. Additional stickers are not available. Please do not apply patch if you will be having  a Nuclear Stress Test,  Echocardiogram, Cardiac CT, MRI, or Chest Xray during the period you would be wearing the  monitor. The patch cannot be worn during these tests. You cannot remove and re-apply the  ZIO XT patch monitor.  Your ZIO patch monitor will be mailed 3 day USPS to your address on file. It may take 3-5 days  to receive your monitor after you have been enrolled.  Once you have received your monitor, please  review the enclosed instructions. Your monitor  has already been registered assigning a specific monitor serial # to you.  Billing and Patient Assistance Program Information  We have supplied [employer] with any of your insurance information on file for billing purposes. [employer] offers a sliding scale Patient Assistance Program for patients that do not have  insurance, or whose insurance does not completely cover the cost of the ZIO monitor.  You must apply for the Patient Assistance Program to qualify for this discounted rate.  To apply, please call [employer] at [phone], select option 4, select option 2, ask to apply for  Patient Assistance Program. Meredeth Ide will ask your household income, and how many people  are in your household. They will quote your out-of-pocket cost based on that information.  [employer] will also be able to set up a 23-month, interest-free payment plan if needed.  Applying the monitor   Shave hair from upper left chest.  Hold abrader disc by orange tab. Rub abrader in 40 strokes over the upper left chest as  indicated in your monitor instructions.  Clean area with 4 enclosed alcohol pads. Let dry.  Apply patch as indicated in monitor instructions. Patch will be placed under collarbone on left  side of chest with arrow pointing upward.  Rub patch adhesive wings for 2 minutes. Remove white label marked "1". Remove the white  label marked "2". Rub patch adhesive wings for 2 additional minutes.  While looking in a mirror, press and release button in center of patch. A small green light will  flash 3-4 times. This will be your only indicator that the monitor has been turned on.  Do not shower for the first 24 hours. You may shower after the first 24 hours.  Press the button if you feel a symptom. You will hear a small click. Record Date, Time and  Symptom in the Patient Logbook.  When you are ready to remove the patch, follow instructions on the last 2 pages of Patient   Logbook. Stick patch monitor onto the last page of Patient Logbook.  Place Patient Logbook in the blue and white box. Use locking tab on box and tape box closed  securely. The blue and white box has prepaid postage on it. Please place it in the mailbox as  soon as possible. Your physician should have your test results approximately 7 days after the  monitor has been mailed back to [facility].  Call [facility] Customer Care at [phone] if you have questions regarding  your ZIO XT patch monitor. Call them immediately if you see an orange light blinking on your  monitor.  If your monitor falls off in less than 4 days, contact our Monitor department at [phone].  If your monitor becomes loose or falls off after 4 days call [employer] at [phone] for  suggestions on securing your monitor

## 2023-06-24 NOTE — Progress Notes (Signed)
Cardiology Office Note:  .   Date:  06/29/2023  ID:  Dorna Bloom, DOB 1956/10/02, MRN [id number] PCP: Aviva Kluver  [facility] Providers Cardiologist:  Bryan Lemma, MD     Chief Complaint  Patient presents with   New Patient (Initial Visit)    Chest pain evaluation; history of mitral prolapse; family history of premature CAD and valvular disease.    History of Present Illness: .     Ann Chan is a  67 y.o. female smoker (smokes about 1/2 pack a day but decreased over the last couple years after long smoking history) with a PMH notable for Graves' disease-post ablative hypothyroidism, reported MVP, HTN polyarthritis with osteoarthritis, COPD/emphysema (on Breo, albuterol), aortic atherosclerosis (noted on imaging study), GERD, with significant family history of CAD/valvular heart disease who presents here for Evaluation of "Atypical Chest Pain" at the request of Jarrett Soho, PA-C [facility]).  Ann Chan was last seen by Jarrett Soho, PA on 04/21/2013 essentially to reestablish care.  BP was 140/90 and noted that she does not really check her blood pressures at home, but was elevated in June.  Noted some mild positional lightheadedness but otherwise doing well.  Denied chest pain.  However, assessment and plan indicated atypical chest pain-recommended referral to cardiology.  She had previously been seen by Dr. Anne Fu back in 2010, underwent a cardiac catheterization with nonobstructive disease with small caliber vessels (see below). Had temporarily been transferred to [facility] due to insurance issues.  Apparently she was supposed to undergo esophageal dilation related to GERD.  [facility] recommended barium swallow (was normal) and EGD recommended, delayed due to concern for cost.       Subjective  INTERVAL HISTORY Ann Chan presents here today somewhat concerned about having her chest discomfort episodes mostly because her family  history.  EKG here today noted PVCs and bigeminy and she does since having some skipped beats here and there but also notes that she feels her heart "racing all the time.  Worse when she takes deep inspiration.  Usually it is worse when she is under stress or has had coffee.  Not associated with syncope or near syncope.  Chest discomfort episode she noted that she notes having a discomfort in the left shoulder and arm that radiated across the chest over to the right arm.  This usually happens when she is lying down.  Can last at least 10 to 15 minutes and may be associated with some shortness of breath.  She does not have this sensation with exertion, does have some mild exertional dyspnea. No resting dyspnea, orthopnea or PND.  No edema.  Mild leg cramping at night, but no claudication symptoms.  Cardiovascular ROS: positive for - chest pain, dyspnea on exertion, irregular heartbeat, palpitations, and rapid heart rate negative for - edema, orthopnea, palpitations, shortness of breath, or syncope or near syncope, TIA or amaurosis fugax, exertional claudication   ROS:  Review of Systems - Negative except chronic morning cough, fatigue.  Joint pains and aches and pains after long days of work.  Family History: Notable for father who died at age 82 after 6 or 7 heart attacks.  Brother with CAD and has had a least 2 or 3 heart attacks.  Sister had "open heart surgery for valve ".  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  She is trying to eat a healthy  diet eating lots of vegetables, but continues to smoke down to 1/2 pack a day.  She did quit smoking for about a year but was not able to stay off.    Objective  Studies Reviewed: Marland Kitchen   EKG Interpretation Date/Time:  Monday June 24 2023 10:16:17 EDT Ventricular Rate:  90 PR Interval:  168 QRS Duration:  94 QT Interval:  380 QTC Calculation: 464 R  Axis:   -43  Text Interpretation: Sinus rhythm with frequent Premature ventricular complexes Possible Left atrial enlargement Left axis deviation Incomplete right bundle branch block Confirmed by Bryan Lemma ([id number]) on 06/24/2023 10:19:54 AM   CATH 05/27/2009 (Dr. Anne Fu): Small caliber LM with ostial calcification.  No suggestion of stenosis.  EF estimated 60%.  No AAA or RAS.  Small caliber distal abdominal aorta but no stenosis. => Suggested possibility of base of activity.  Labs from June 2024: Free T4 1.21, TSH 0.86.  BUN/Cr 7/0.78, otherwise normal CMP.  TC 2 9, TG 70, HDL 85, LDLc 111.  Risk Assessment/Calculations:          Physical Exam:   VS:  BP 112/74   Pulse 90   Ht 5\' 5"  (1.651 m)   Wt 132 lb 3.2 oz (60 kg)   SpO2 98%   BMI 22.00 kg/m    Wt Readings from Last 3 Encounters:  06/24/23 132 lb 3.2 oz (60 kg)  02/19/20 138 lb (62.6 kg)  01/01/20 141 lb (64 kg)    GEN: Well nourished, well developed in no acute distress; well groomed NECK: No JVD; No carotid bruits CARDIAC: Normal S1, S2; RRR - with frequent ectopy; + 1/6 SM; No rubs, gallops; mild chest wall tenderness. RESPIRATORY:  Clear to auscultation without rales, wheezing or rhonchi ; nonlabored, good air movement. ABDOMEN: Soft, non-tender, non-distended EXTREMITIES:  No edema; No deformity     ASSESSMENT AND PLAN: .    Problem List Items Addressed This Visit       Cardiology Problems   PVC's (premature ventricular contractions) (Chronic)    Significant PVCs noted on EKG today.  Will give her having chest discomfort and significant cardiac history need to establish PVC burden.  Also having rapid heart rate sensations.  Plan: 7-day Zio patch; 2D Echo as well as ischemic evaluation with Coronary CTA      Relevant Orders   ECHOCARDIOGRAM COMPLETE   LONG TERM MONITOR (3-14 DAYS)     Other   Ejection murmur (Chronic)    Has a history of "mitral prolapse but now has a systolic ejection murmur.  This is in  the setting of also having chest discomfort and PVCs.  Plan: Check 2D echo      Relevant Orders   ECHOCARDIOGRAM COMPLETE   Family history of premature coronary artery disease (Chronic)    Significant family history of CAD with LDL of 111.  Complaining of precordial pain.  Pain is resting pain not exertional.  As such I think get hold off on ischemic evaluation for now until we assess her PVCs/palpitations and rapid palpitations.  Plan: Start off evaluation with echocardiogram and Zio patch monitor as well as Coronary CTA which allow [place] to evaluate extent of CAD as well as potential ischemic CAD.      H/O mitral valve prolapse (Chronic)    Previous diagnosis but no echo recently.  With murmur on exam and PVCs, will check 2D echo      Relevant Orders   ECHOCARDIOGRAM COMPLETE   LONG TERM  MONITOR (3-14 DAYS)   Hypothyroidism    On Synthroid.  Recent levels seem to be stable arguing against thyroid related palpitations.      Relevant Orders   EKG 12-Lead (Completed)   Precordial pain - Primary    Referred for precordial pain which is not exertional but she is not very active and therefore difficult to tell if she would or would not have exertional chest pain.  Significant family history in a smoker with hyperlipidemia and hypertension as risk factors.  Plan: We will start off with evaluation of PVCs with 7-day Zio patch monitor as well as an echocardiogram along with Coronary CTA.      Racing heart beat (Chronic)    Could be having bursts of PAT or even potentially SVT/NSVT. Marland Kitchen Plan: Check 7-day Zio patch      Relevant Orders   LONG TERM MONITOR (3-14 DAYS)        Dispo: Return in about 2 months (around 08/24/2023) for Routine Follow-up after testing ~ 1-2 months.  Total time spent: 30 min spent with patient + 32 min spent charting = 62 min     Signed, Marykay Lex, MD, MS Bryan Lemma, M.D., M.S. Interventional Cardiologist  [facility]  Pager #  [phone] Phone # [phone] [address] [place], [place] [address]

## 2023-06-29 ENCOUNTER — Encounter: Payer: Self-pay | Admitting: Cardiology

## 2023-06-29 NOTE — Assessment & Plan Note (Signed)
Could be having bursts of PAT or even potentially SVT/NSVT. Marland Kitchen Plan: Check 7-day Zio patch

## 2023-06-29 NOTE — Assessment & Plan Note (Addendum)
Significant family history of CAD with LDL of 111.  Complaining of precordial pain.  Pain is resting pain not exertional.  As such I think get hold off on ischemic evaluation for now until we assess her PVCs/palpitations and rapid palpitations.  Plan: Start off evaluation with echocardiogram and Zio patch monitor as well as Coronary CTA which allow [place] to evaluate extent of CAD as well as potential ischemic CAD.

## 2023-06-29 NOTE — Assessment & Plan Note (Addendum)
Significant PVCs noted on EKG today.  Will give her having chest discomfort and significant cardiac history need to establish PVC burden.  Also having rapid heart rate sensations.  Plan: 7-day Zio patch; 2D Echo as well as ischemic evaluation with Coronary CTA

## 2023-06-29 NOTE — Assessment & Plan Note (Signed)
On Synthroid.  Recent levels seem to be stable arguing against thyroid related palpitations.

## 2023-06-29 NOTE — Assessment & Plan Note (Signed)
Has a history of "mitral prolapse but now has a systolic ejection murmur.  This is in the setting of also having chest discomfort and PVCs.  Plan: Check 2D echo

## 2023-06-29 NOTE — Assessment & Plan Note (Signed)
Previous diagnosis but no echo recently.  With murmur on exam and PVCs, will check 2D echo

## 2023-06-29 NOTE — Assessment & Plan Note (Addendum)
Referred for precordial pain which is not exertional but she is not very active and therefore difficult to tell if she would or would not have exertional chest pain.  Significant family history in a smoker with hyperlipidemia and hypertension as risk factors.  Plan: We will start off with evaluation of PVCs with 7-day Zio patch monitor as well as an echocardiogram along with Coronary CTA.

## 2023-07-11 ENCOUNTER — Ambulatory Visit (HOSPITAL_COMMUNITY): Payer: Medicare HMO | Attending: Cardiology

## 2023-07-11 DIAGNOSIS — Z8679 Personal history of other diseases of the circulatory system: Secondary | ICD-10-CM | POA: Insufficient documentation

## 2023-07-11 DIAGNOSIS — R011 Cardiac murmur, unspecified: Secondary | ICD-10-CM | POA: Diagnosis not present

## 2023-07-11 DIAGNOSIS — I493 Ventricular premature depolarization: Secondary | ICD-10-CM | POA: Insufficient documentation

## 2023-07-12 ENCOUNTER — Telehealth: Payer: Self-pay | Admitting: Cardiology

## 2023-07-12 LAB — ECHOCARDIOGRAM COMPLETE
AR max vel: 0.98 cm2
AV Area VTI: 0.94 cm2
AV Area mean vel: 0.94 cm2
AV Mean grad: 12 mm[Hg]
AV Peak grad: 20.8 mm[Hg]
Ao pk vel: 2.28 m/s
Area-P 1/2: 3.12 cm2
S' Lateral: 2.8 cm

## 2023-07-12 NOTE — Telephone Encounter (Signed)
Patient's daughter is calling about the patient's heart monitor. Said that she want to know if she could put on the heart monitor own later due to patient just having an echo done

## 2023-07-12 NOTE — Telephone Encounter (Signed)
Spoke with Cristine Polio (dpr)- she reports that a monitor was ordered for the patient to wear for 2 weeks. She needs/wants to hold off on wearing it for 1 or 2 weeks. This is because the patient is very stressed out because her son was sent to [place] to work on the [employer]. She is so stressed and upset and is afraid that something will happen to him, she is crying on and off and feels that the information will be inaccurate.  I spoke with Burt Knack, RN about this. Instructed that the patient can delay it for 1-2 weeks if needed, but needs to call the monitor company to let them know. Also, if she does not put on monitor by 2 weeks, then she needs to send it back.  Information given and she verbalized understanding of all directions. She plans to wear the monitor by 2 weeks and will call the company.

## 2023-07-19 DIAGNOSIS — I493 Ventricular premature depolarization: Secondary | ICD-10-CM | POA: Diagnosis not present

## 2023-07-19 DIAGNOSIS — R000 Tachycardia, unspecified: Secondary | ICD-10-CM | POA: Diagnosis not present

## 2023-07-19 DIAGNOSIS — Z8679 Personal history of other diseases of the circulatory system: Secondary | ICD-10-CM | POA: Diagnosis not present

## 2023-08-08 DIAGNOSIS — I493 Ventricular premature depolarization: Secondary | ICD-10-CM | POA: Diagnosis not present

## 2023-08-22 ENCOUNTER — Telehealth: Payer: Self-pay | Admitting: *Deleted

## 2023-08-22 ENCOUNTER — Encounter: Payer: Self-pay | Admitting: *Deleted

## 2023-08-22 NOTE — Telephone Encounter (Signed)
Echocardiogram is also normal pump function with an ejection fraction of 60 to 65%.  (Normal ranges 50 to 70%).  There is evidence of abnormal relaxation but no regional wall motion maladies to suggest prior injury.  Normal right ventricle.  Normal mitral valve.  Mild aortic valve calcification with very mildly increased pressure gradient suggesting very mild narrowing/aortic stenosis.  Something we can follow-up in 3 years.  This would be the reason for her murmur.   The mitral valve itself did not appear to have any abnormalities.  No mitral prolapse or notable regurgitation.   Ann Chan

## 2023-08-22 NOTE — Telephone Encounter (Signed)
Second  attempt to contact patient . Letter mailed   Patient has upcoming appt 09/04/23

## 2023-08-27 DIAGNOSIS — I10 Essential (primary) hypertension: Secondary | ICD-10-CM | POA: Diagnosis not present

## 2023-08-27 DIAGNOSIS — I700 Atherosclerosis of aorta: Secondary | ICD-10-CM | POA: Diagnosis not present

## 2023-09-04 ENCOUNTER — Ambulatory Visit: Payer: Medicare HMO | Attending: Cardiology | Admitting: Cardiology

## 2023-09-04 ENCOUNTER — Encounter: Payer: Self-pay | Admitting: Cardiology

## 2023-09-04 VITALS — BP 120/74 | HR 73 | Ht 65.0 in | Wt 136.4 lb

## 2023-09-04 DIAGNOSIS — Z720 Tobacco use: Secondary | ICD-10-CM

## 2023-09-04 DIAGNOSIS — R011 Cardiac murmur, unspecified: Secondary | ICD-10-CM

## 2023-09-04 DIAGNOSIS — I10 Essential (primary) hypertension: Secondary | ICD-10-CM

## 2023-09-04 DIAGNOSIS — Z8679 Personal history of other diseases of the circulatory system: Secondary | ICD-10-CM | POA: Diagnosis not present

## 2023-09-04 DIAGNOSIS — K219 Gastro-esophageal reflux disease without esophagitis: Secondary | ICD-10-CM

## 2023-09-04 DIAGNOSIS — I493 Ventricular premature depolarization: Secondary | ICD-10-CM

## 2023-09-04 DIAGNOSIS — R000 Tachycardia, unspecified: Secondary | ICD-10-CM | POA: Diagnosis not present

## 2023-09-04 NOTE — Progress Notes (Signed)
Cardiology Office Note:  .   Date:  09/09/2023  ID:  Dorna Bloom, DOB 10-15-1955, MRN [id number] PCP: Aviva Kluver  [facility] Providers Cardiologist:  Bryan Lemma, MD     Chief Complaint  Patient presents with   Follow-up    27-month follow-up to discuss test results   Palpitations    Zio patch monitor results    Patient Profile: .     Ann Chan is a 67 y.o. female smoker with a PMH notable for (smokes about 1/2 pack a day but decreased over the last couple years after long smoking history) with a PMH notable for Graves' disease-post ablative hypothyroidism, reported MVP, HTN polyarthritis with osteoarthritis, COPD/emphysema (on Breo, albuterol), aortic atherosclerosis (noted on imaging study), GERD, with significant family history of CAD/valvular heart disease who presents here for 57-month follow-up evaluation of " Atypical Chest Pain" .     Ann Chan was last seen on 06/24/2023 for Evaluation of "Atypical Chest Pain" at the request of Jarrett Soho, PA-C [facility] [employer]).  She was noted to have elevated blood pressures at her Dr. Alfonso Patten office of 140/90.  There was discussion about potentially needing to have esophageal dilation that been delayed due to cost.  Somewhere in the story there was discussion about chest pain and she was therefore referred for evaluation, in light of her having a family history of CAD.  There is also suggestion that she had a history of mitral prolapse and occasional episodes of racing heartbeats. => We ordered a monitor and echocardiogram to assess the palpitations and mitral prolapse.   Subjective  Discussed the use of AI scribe software for clinical note transcription with the patient, who gave verbal consent to proceed.  History of Present Illness   The patient, with a history of hypertension, presented with a complaint of elevated blood pressure. She reported feeling generally well, with no associated symptoms  such as chest pain or palpitations. She mentioned a lack of appetite on the day of the consultation, but did not attribute this to any specific cause. She has been managing her acid reflux with homemade ginger chews, which she believes also help with her blood pressure.  The patient has been prescribed a combination of telmisartan and amlodipine for her hypertension, but she reported discontinuing the medication due to discomfort across her chest after taking it at night. She expressed a willingness to resume the medication at a lower dose, following the consultation.  In addition to hypertension, the patient has a history of thyroid problems and has been experiencing difficulty swallowing. She had an esophageal dilation procedure scheduled, but it was not performed as no abnormalities were found. The patient has been managing her swallowing difficulties independently.  The patient also reported occasional palpitations, but these were not associated with any discomfort or distress. She has been living alone and dealing with personal stressors, which she believes may be contributing to her health issues. She expressed a desire to move to a quieter location to alleviate some of this stress.     Cardiovascular ROS:  Per HPI  ROS:  Review of Systems - Negative except symptoms noted in HPI    Objective   Studies Reviewed: .        Echocardiogram: Left ventricle normal function, no wall motion abnormality, ejection fraction 60-65%, grade 1 diastolic dysfunction, right ventricle normal, mitral valve normal, aortic valve calcification with 12 mmHg gradient (07/12/2023) Zio patch cardiac monitor: Heart rate 50-137 bpm, average 84 bpm, 4%  premature ventricular contractions, two episodes of ventricular tachycardia (6 beats, 158 bpm and 127 bpm), nine atrial runs (longest 10 beats, max 122 bpm)  Risk Assessment/Calculations:              Physical Exam:   VS:  BP 120/74   Pulse 73   Ht 5\' 5"  (1.651  m)   Wt 136 lb 6.4 oz (61.9 kg)   SpO2 96%   BMI 22.70 kg/m   initial reading was 180/95. Wt Readings from Last 3 Encounters:  09/04/23 136 lb 6.4 oz (61.9 kg)  06/24/23 132 lb 3.2 oz (60 kg)  02/19/20 138 lb (62.6 kg)    GEN: Well nourished, well developed in no acute distress; well-groomed, but has a smell of cigarette smoke. NECK: No JVD; No carotid bruits CARDIAC: RRR with ectopy noted but nothing sustained;,Normal S1, S2; soft 1/6 SEM at RUSB but otherwise no murmurs, rubs, gallops RESPIRATORY:  Clear to auscultation without rales, wheezing or rhonchi ; nonlabored, good air movement. ABDOMEN: Soft, non-tender, non-distended EXTREMITIES:  No edema; No deformity      ASSESSMENT AND PLAN: .    Problem List Items Addressed This Visit       Cardiology Problems   Essential hypertension (Chronic)    Elevated blood pressure noted during visit. Patient has been non-compliant with antihypertensive medications due to side effects.  Discussed the importance of consistent blood pressure control to prevent cardiovascular complications. -Resume Telmisartan/Amlodipine at half dose daily.  (She has not been taking this regularly) -Check blood pressure at home and report readings to primary care provider.      PVC's (premature ventricular contractions) - Primary (Chronic)    Occasional PVCs noted on recent Zio monitor, with two short runs of ventricular tachycardia. Patient reports occasional palpitations but no associated chest pain or pressure. Essentially normal echocardiogram with no structural abnormalities.  No signs/symptoms of ischemia. -No further action required at this time unless symptoms worsen.  If symptoms warrant, would consider beta-blocker as part of her BP regimen.        Other   Declined smoking cessation    Briefly discussed importance of smoking cessation, was not interested in discussing.      Ejection murmur (Chronic)    Aortic valve calcification with mild  aortic stenosis noted on recent echocardiogram, likely causing the heart murmur. No symptoms of chest pain or pressure reported. -Plan to repeat echocardiogram in 3 years to monitor progression of stenosis.      GERD (gastroesophageal reflux disease) (Chronic)    Patient reports occasional heartburn, managed with dietary modifications and ginger chews. -Continue current management strategy.      H/O mitral valve prolapse (Chronic)    Not confirmed on echo.      Racing heart beat (Chronic)    Not seem to bother her right now.  She did have short atrial and ventricular bursts of less than 10 long.  Nothing sustained.  3.7% PVCs.  Low threshold to consider beta-blocker as part of her BP therapy.                Follow-Up: Return in about 2 years (around 09/03/2025). Plan to see patient in 2 years for routine follow-up, with an echocardiogram to be ordered in 3 years. Encourage patient to maintain regular follow-up with primary care provider, particularly to monitor blood pressure.    Total time spent: 16 min spent with patient + 15 min spent charting = 31 min    Signed,  Marykay Lex, MD, MS Bryan Lemma, M.D., M.S. Interventional Cardiologist  [facility]  Pager # [phone] Phone # [phone] [address] [place], [place] [address]

## 2023-09-04 NOTE — Patient Instructions (Signed)
Medication Instructions:   Take your blood pressure medication when you get home.   Check it and keep a recording- take to your next appointment with your Primary doctor     Lab Work: Not needed    Testing/Procedures: Not needed   Follow-Up: At [facility], you and your health needs are our priority.  As part of our continuing mission to provide you with exceptional heart care, we have created designated Provider Care Teams.  These Care Teams include your primary Cardiologist (physician) and Advanced Practice Providers (APPs -  Physician Assistants and Nurse Practitioners) who all work together to provide you with the care you need, when you need it.     Your next appointment:   2 year(s)  The format for your next appointment:   In Person  Provider:   Bryan Lemma, MD

## 2023-09-09 ENCOUNTER — Encounter: Payer: Self-pay | Admitting: Cardiology

## 2023-09-09 DIAGNOSIS — I10 Essential (primary) hypertension: Secondary | ICD-10-CM | POA: Insufficient documentation

## 2023-09-09 DIAGNOSIS — Z720 Tobacco use: Secondary | ICD-10-CM | POA: Insufficient documentation

## 2023-09-09 DIAGNOSIS — K219 Gastro-esophageal reflux disease without esophagitis: Secondary | ICD-10-CM | POA: Insufficient documentation

## 2023-09-09 NOTE — Assessment & Plan Note (Signed)
Not seem to bother her right now.  She did have short atrial and ventricular bursts of less than 10 long.  Nothing sustained.  3.7% PVCs.  Low threshold to consider beta-blocker as part of her BP therapy.

## 2023-09-09 NOTE — Assessment & Plan Note (Signed)
Briefly discussed importance of smoking cessation, was not interested in discussing.

## 2023-09-09 NOTE — Assessment & Plan Note (Addendum)
Occasional PVCs noted on recent Zio monitor, with two short runs of ventricular tachycardia. Patient reports occasional palpitations but no associated chest pain or pressure. Essentially normal echocardiogram with no structural abnormalities.  No signs/symptoms of ischemia. -No further action required at this time unless symptoms worsen.  If symptoms warrant, would consider beta-blocker as part of her BP regimen.

## 2023-09-09 NOTE — Assessment & Plan Note (Signed)
Aortic valve calcification with mild aortic stenosis noted on recent echocardiogram, likely causing the heart murmur. No symptoms of chest pain or pressure reported. -Plan to repeat echocardiogram in 3 years to monitor progression of stenosis.

## 2023-09-09 NOTE — Assessment & Plan Note (Signed)
Elevated blood pressure noted during visit. Patient has been non-compliant with antihypertensive medications due to side effects.  Discussed the importance of consistent blood pressure control to prevent cardiovascular complications. -Resume Telmisartan/Amlodipine at half dose daily.  (She has not been taking this regularly) -Check blood pressure at home and report readings to primary care provider.

## 2023-09-09 NOTE — Assessment & Plan Note (Signed)
Patient reports occasional heartburn, managed with dietary modifications and ginger chews. -Continue current management strategy.

## 2023-09-09 NOTE — Assessment & Plan Note (Signed)
Not confirmed on echo.

## 2023-12-02 DIAGNOSIS — E890 Postprocedural hypothyroidism: Secondary | ICD-10-CM | POA: Diagnosis not present

## 2024-01-20 DIAGNOSIS — R5383 Other fatigue: Secondary | ICD-10-CM | POA: Diagnosis not present

## 2024-01-20 DIAGNOSIS — E890 Postprocedural hypothyroidism: Secondary | ICD-10-CM | POA: Diagnosis not present

## 2024-01-20 DIAGNOSIS — H539 Unspecified visual disturbance: Secondary | ICD-10-CM | POA: Diagnosis not present

## 2024-01-20 DIAGNOSIS — Z974 Presence of external hearing-aid: Secondary | ICD-10-CM | POA: Diagnosis not present

## 2024-02-13 DIAGNOSIS — H905 Unspecified sensorineural hearing loss: Secondary | ICD-10-CM | POA: Diagnosis not present

## 2024-04-08 DIAGNOSIS — K644 Residual hemorrhoidal skin tags: Secondary | ICD-10-CM | POA: Diagnosis not present

## 2024-04-08 DIAGNOSIS — R140 Abdominal distension (gaseous): Secondary | ICD-10-CM | POA: Diagnosis not present

## 2024-04-14 DIAGNOSIS — K649 Unspecified hemorrhoids: Secondary | ICD-10-CM | POA: Diagnosis not present

## 2024-04-14 DIAGNOSIS — R140 Abdominal distension (gaseous): Secondary | ICD-10-CM | POA: Diagnosis not present

## 2024-04-14 DIAGNOSIS — R1319 Other dysphagia: Secondary | ICD-10-CM | POA: Diagnosis not present

## 2024-04-14 DIAGNOSIS — R6881 Early satiety: Secondary | ICD-10-CM | POA: Diagnosis not present

## 2024-04-14 DIAGNOSIS — R142 Eructation: Secondary | ICD-10-CM | POA: Diagnosis not present

## 2024-04-24 DIAGNOSIS — D125 Benign neoplasm of sigmoid colon: Secondary | ICD-10-CM | POA: Diagnosis not present

## 2024-04-24 DIAGNOSIS — K6389 Other specified diseases of intestine: Secondary | ICD-10-CM | POA: Diagnosis not present

## 2024-04-24 DIAGNOSIS — K6289 Other specified diseases of anus and rectum: Secondary | ICD-10-CM | POA: Diagnosis not present

## 2024-04-24 DIAGNOSIS — Z1211 Encounter for screening for malignant neoplasm of colon: Secondary | ICD-10-CM | POA: Diagnosis not present

## 2024-04-24 DIAGNOSIS — K633 Ulcer of intestine: Secondary | ICD-10-CM | POA: Diagnosis not present

## 2024-04-24 DIAGNOSIS — D122 Benign neoplasm of ascending colon: Secondary | ICD-10-CM | POA: Diagnosis not present

## 2024-04-24 DIAGNOSIS — D124 Benign neoplasm of descending colon: Secondary | ICD-10-CM | POA: Diagnosis not present

## 2024-07-08 DIAGNOSIS — K219 Gastro-esophageal reflux disease without esophagitis: Secondary | ICD-10-CM | POA: Diagnosis not present

## 2024-07-08 DIAGNOSIS — Z6822 Body mass index (BMI) 22.0-22.9, adult: Secondary | ICD-10-CM | POA: Diagnosis not present

## 2024-07-08 DIAGNOSIS — I10 Essential (primary) hypertension: Secondary | ICD-10-CM | POA: Diagnosis not present
